# Patient Record
Sex: Male | Born: 1970 | Race: Black or African American | Hispanic: No | Marital: Single | Smoking: Never smoker
Health system: Southern US, Community
[De-identification: ages and names within clinical notes are randomized; demographics above are authoritative.]

## PROBLEM LIST (undated history)

## (undated) DIAGNOSIS — T7840XA Allergy, unspecified, initial encounter: Secondary | ICD-10-CM

## (undated) DIAGNOSIS — G4733 Obstructive sleep apnea (adult) (pediatric): Secondary | ICD-10-CM

## (undated) DIAGNOSIS — L309 Dermatitis, unspecified: Secondary | ICD-10-CM

## (undated) DIAGNOSIS — I10 Essential (primary) hypertension: Secondary | ICD-10-CM

## (undated) DIAGNOSIS — D573 Sickle-cell trait: Secondary | ICD-10-CM

## (undated) DIAGNOSIS — E559 Vitamin D deficiency, unspecified: Secondary | ICD-10-CM

## (undated) DIAGNOSIS — N3281 Overactive bladder: Secondary | ICD-10-CM

## (undated) HISTORY — DX: Essential (primary) hypertension: I10

## (undated) HISTORY — DX: Sickle-cell trait: D57.3

## (undated) HISTORY — DX: Allergy, unspecified, initial encounter: T78.40XA

## (undated) HISTORY — PX: NO PAST SURGERIES: SHX2092

---

## 2015-03-19 ENCOUNTER — Encounter: Payer: Self-pay | Admitting: *Deleted

## 2015-03-20 ENCOUNTER — Ambulatory Visit: Payer: BLUE CROSS/BLUE SHIELD | Admitting: Family Medicine

## 2015-03-25 ENCOUNTER — Encounter: Payer: Self-pay | Admitting: Family Medicine

## 2015-03-25 ENCOUNTER — Ambulatory Visit (INDEPENDENT_AMBULATORY_CARE_PROVIDER_SITE_OTHER): Payer: BLUE CROSS/BLUE SHIELD | Admitting: Family Medicine

## 2015-03-25 VITALS — BP 118/70 | HR 68 | Temp 98.5°F | Resp 14 | Ht 75.0 in | Wt 206.0 lb

## 2015-03-25 DIAGNOSIS — Z113 Encounter for screening for infections with a predominantly sexual mode of transmission: Secondary | ICD-10-CM | POA: Diagnosis not present

## 2015-03-25 DIAGNOSIS — Z0000 Encounter for general adult medical examination without abnormal findings: Secondary | ICD-10-CM | POA: Diagnosis not present

## 2015-03-25 DIAGNOSIS — K4090 Unilateral inguinal hernia, without obstruction or gangrene, not specified as recurrent: Secondary | ICD-10-CM

## 2015-03-25 DIAGNOSIS — I10 Essential (primary) hypertension: Secondary | ICD-10-CM

## 2015-03-25 HISTORY — DX: Unilateral inguinal hernia, without obstruction or gangrene, not specified as recurrent: K40.90

## 2015-03-25 NOTE — Progress Notes (Signed)
Patient ID: Aaron Page, male   DOB: 09-06-1971, 44 y.o.   MRN: [id number]   Subjective:    Patient ID: Aaron Page, male    DOB: 07-01-1971, 44 y.o.   MRN: [id number]  Patient presents for CPE  here to establish care and for physical exam. He is concerned about urinary frequency with a new blood pressure medicine he was put on 3 months ago. He states that his blood pressure which is a little bit high but he was having a lot of stress at that time was also moving to [place] but his nurse practitioner put him on terraces in 3 mg which she takes in the morning. He does not have any history of BPH. He was not tried on any other medications. He also has occasional pain in his right inguinal region he has not noticed any significant hernia. He does have history of irritable bowel syndrome and had early EGD and colonoscopy but those symptoms have resolved. 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 He tries to stay active and keep his weight down to control his pain.  He thinks he has tetanus booster in 2011. He is due for fasting labs.  Review Of Systems:  GEN- denies fatigue, fever, weight loss,weakness, recent illness HEENT- denies eye drainage, change in vision, nasal discharge, CVS- denies chest pain, palpitations RESP- denies SOB, cough, wheeze ABD- denies N/V, change in stools, abd pain GU- denies dysuria, hematuria, dribbling, incontinence MSK- denies joint pain, muscle aches, injury Neuro- denies headache, dizziness, syncope, seizure activity       Objective:    BP 118/70 mmHg  Pulse 68  Temp(Src) 98.5 F (36.9 C) (Oral)  Resp 14  Ht 6\' 3"  (1.905 m)  Wt 206 lb (93.441 kg)  BMI 25.75 kg/m2 GEN- NAD, alert and oriented x3 HEENT- PERRL, EOMI, non injected sclera, pink conjunctiva, MMM, oropharynx clear Neck- Supple, no thyromegaly CVS- RRR,  no murmur RESP-CTAB ABD-NABS,soft,NT,ND GU- Small bulge in right inguinal region, testes descended bilat, no penile lesions EXT- No edema Pulses- Radial, DP- 2+        Assessment & Plan:      Problem List Items Addressed This Visit    None    Visit Diagnoses    Routine general medical examination at a health care facility    -  Primary    CPE done, fasting labs with STD screen at request in AM, obtain records       Note: This dictation was prepared with Dragon dictation along with smaller phrase technology. Any transcriptional errors that result from this process are unintentional.

## 2015-03-25 NOTE — Patient Instructions (Signed)
Cut back Hytrin 1 mg once a day Return for fasting labs  Don't start any supplements until I call with results  F/U 6 months

## 2015-03-25 NOTE — Addendum Note (Signed)
Addended by: Vic Blackbird F on: 03/25/2015 05:33 PM   Modules accepted: Orders

## 2015-03-25 NOTE — Assessment & Plan Note (Signed)
Small inguinal hernia, not significant at this time

## 2015-03-25 NOTE — Assessment & Plan Note (Signed)
Query if pt really needs BP meds, I am going to try to titrate him off of this, decrease to 1mg  of terazosin for 2 weeks, we will f/u via phone for his BP readings Consider D/C all together or switch to another med as he is having urinary frequency with this.

## 2015-03-26 ENCOUNTER — Other Ambulatory Visit: Payer: BLUE CROSS/BLUE SHIELD

## 2015-03-26 LAB — COMPREHENSIVE METABOLIC PANEL
ALBUMIN: 4.2 g/dL (ref 3.5–5.2)
ALK PHOS: 42 U/L (ref 39–117)
ALT: 18 U/L (ref 0–53)
AST: 16 U/L (ref 0–37)
BILIRUBIN TOTAL: 0.9 mg/dL (ref 0.2–1.2)
BUN: 16 mg/dL (ref 6–23)
CO2: 27 mEq/L (ref 19–32)
Calcium: 9.5 mg/dL (ref 8.4–10.5)
Chloride: 105 mEq/L (ref 96–112)
Creat: 1.04 mg/dL (ref 0.50–1.35)
Glucose, Bld: 86 mg/dL (ref 70–99)
Potassium: 4.7 mEq/L (ref 3.5–5.3)
Sodium: 141 mEq/L (ref 135–145)
Total Protein: 6.8 g/dL (ref 6.0–8.3)

## 2015-03-26 LAB — CBC WITH DIFFERENTIAL/PLATELET
BASOS ABS: 0 10*3/uL (ref 0.0–0.1)
Basophils Relative: 0 % (ref 0–1)
EOS PCT: 1 % (ref 0–5)
Eosinophils Absolute: 0 10*3/uL (ref 0.0–0.7)
HEMATOCRIT: 42.5 % (ref 39.0–52.0)
Hemoglobin: 14.3 g/dL (ref 13.0–17.0)
LYMPHS PCT: 32 % (ref 12–46)
Lymphs Abs: 1.2 10*3/uL (ref 0.7–4.0)
MCH: 26.2 pg (ref 26.0–34.0)
MCHC: 33.6 g/dL (ref 30.0–36.0)
MCV: 78 fL (ref 78.0–100.0)
MONO ABS: 0.2 10*3/uL (ref 0.1–1.0)
MONOS PCT: 6 % (ref 3–12)
MPV: 10.3 fL (ref 8.6–12.4)
NEUTROS ABS: 2.4 10*3/uL (ref 1.7–7.7)
Neutrophils Relative %: 61 % (ref 43–77)
PLATELETS: 253 10*3/uL (ref 150–400)
RBC: 5.45 MIL/uL (ref 4.22–5.81)
RDW: 15.6 % — ABNORMAL HIGH (ref 11.5–15.5)
WBC: 3.9 10*3/uL — AB (ref 4.0–10.5)

## 2015-03-26 LAB — LIPID PANEL
CHOLESTEROL: 189 mg/dL (ref 0–200)
HDL: 60 mg/dL (ref >=40)
LDL Cholesterol: 116 mg/dL — ABNORMAL HIGH (ref 0–99)
Total CHOL/HDL Ratio: 3.2 Ratio
Triglycerides: 64 mg/dL (ref <150)
VLDL: 13 mg/dL (ref 0–40)

## 2015-03-26 LAB — RPR

## 2015-03-26 LAB — TSH: TSH: 0.87 u[IU]/mL (ref 0.350–4.500)

## 2015-03-27 LAB — VITAMIN D 25 HYDROXY (VIT D DEFICIENCY, FRACTURES): Vit D, 25-Hydroxy: 16 ng/mL — ABNORMAL LOW (ref 30–100)

## 2015-03-27 LAB — HIV ANTIBODY (ROUTINE TESTING W REFLEX): HIV 1&2 Ab, 4th Generation: NONREACTIVE

## 2015-03-28 LAB — GC/CHLAMYDIA PROBE AMP, URINE
Chlamydia, Swab/Urine, PCR: NEGATIVE
GC Probe Amp, Urine: NEGATIVE

## 2015-03-31 ENCOUNTER — Other Ambulatory Visit: Payer: Self-pay | Admitting: *Deleted

## 2015-03-31 MED ORDER — VITAMIN D (ERGOCALCIFEROL) 1.25 MG (50000 UNIT) PO CAPS: ORAL_CAPSULE | ORAL | Status: DC

## 2015-09-09 ENCOUNTER — Ambulatory Visit: Payer: BLUE CROSS/BLUE SHIELD | Admitting: Family Medicine

## 2015-09-14 ENCOUNTER — Ambulatory Visit: Payer: BLUE CROSS/BLUE SHIELD | Admitting: Family Medicine

## 2015-09-14 ENCOUNTER — Encounter: Payer: Self-pay | Admitting: Family Medicine

## 2015-10-16 ENCOUNTER — Encounter: Payer: Self-pay | Admitting: Family Medicine

## 2015-10-16 ENCOUNTER — Ambulatory Visit (INDEPENDENT_AMBULATORY_CARE_PROVIDER_SITE_OTHER): Payer: BLUE CROSS/BLUE SHIELD | Admitting: Family Medicine

## 2015-10-16 VITALS — BP 138/74 | HR 72 | Temp 98.8°F | Resp 14 | Ht 75.0 in | Wt 207.0 lb

## 2015-10-16 DIAGNOSIS — Z113 Encounter for screening for infections with a predominantly sexual mode of transmission: Secondary | ICD-10-CM | POA: Diagnosis not present

## 2015-10-16 DIAGNOSIS — E559 Vitamin D deficiency, unspecified: Secondary | ICD-10-CM | POA: Diagnosis not present

## 2015-10-16 DIAGNOSIS — I10 Essential (primary) hypertension: Secondary | ICD-10-CM | POA: Diagnosis not present

## 2015-10-16 DIAGNOSIS — L309 Dermatitis, unspecified: Secondary | ICD-10-CM | POA: Insufficient documentation

## 2015-10-16 LAB — COMPREHENSIVE METABOLIC PANEL
ALT: 18 U/L (ref 9–46)
AST: 19 U/L (ref 10–40)
Albumin: 4 g/dL (ref 3.6–5.1)
Alkaline Phosphatase: 54 U/L (ref 40–115)
BUN: 14 mg/dL (ref 7–25)
CHLORIDE: 106 mmol/L (ref 98–110)
CO2: 28 mmol/L (ref 20–31)
CREATININE: 0.99 mg/dL (ref 0.60–1.35)
Calcium: 9.2 mg/dL (ref 8.6–10.3)
GLUCOSE: 86 mg/dL (ref 70–99)
Potassium: 4.3 mmol/L (ref 3.5–5.3)
SODIUM: 139 mmol/L (ref 135–146)
TOTAL PROTEIN: 6.5 g/dL (ref 6.1–8.1)
Total Bilirubin: 0.8 mg/dL (ref 0.2–1.2)

## 2015-10-16 LAB — CBC WITH DIFFERENTIAL/PLATELET
BASOS PCT: 1 % (ref 0–1)
Basophils Absolute: 0 10*3/uL (ref 0.0–0.1)
EOS ABS: 0 10*3/uL (ref 0.0–0.7)
Eosinophils Relative: 1 % (ref 0–5)
HCT: 43.4 % (ref 39.0–52.0)
Hemoglobin: 14.2 g/dL (ref 13.0–17.0)
Lymphocytes Relative: 43 % (ref 12–46)
Lymphs Abs: 1.3 10*3/uL (ref 0.7–4.0)
MCH: 25.9 pg — AB (ref 26.0–34.0)
MCHC: 32.7 g/dL (ref 30.0–36.0)
MCV: 79.1 fL (ref 78.0–100.0)
MONOS PCT: 4 % (ref 3–12)
MPV: 10.4 fL (ref 8.6–12.4)
Monocytes Absolute: 0.1 10*3/uL (ref 0.1–1.0)
NEUTROS PCT: 51 % (ref 43–77)
Neutro Abs: 1.5 10*3/uL — ABNORMAL LOW (ref 1.7–7.7)
PLATELETS: 251 10*3/uL (ref 150–400)
RBC: 5.49 MIL/uL (ref 4.22–5.81)
RDW: 15 % (ref 11.5–15.5)
WBC: 3 10*3/uL — ABNORMAL LOW (ref 4.0–10.5)

## 2015-10-16 MED ORDER — TERAZOSIN HCL 1 MG PO CAPS: 1.0000 mg | ORAL_CAPSULE | Freq: Every day | ORAL | Status: DC

## 2015-10-16 MED ORDER — TRIAMCINOLONE ACETONIDE 0.1 % EX CREA: 1.0000 "application " | TOPICAL_CREAM | Freq: Two times a day (BID) | CUTANEOUS | Status: DC

## 2015-10-16 NOTE — Assessment & Plan Note (Signed)
Triamcinolone cream BID as well as moisturizer.

## 2015-10-16 NOTE — Assessment & Plan Note (Signed)
Well controlled, no chagne to meds

## 2015-10-16 NOTE — Patient Instructions (Signed)
Use cream twice a day We will call with lab results F/U 6 months for PHYSICAL

## 2015-10-16 NOTE — Progress Notes (Signed)
Patient ID: Aaron Page, male   DOB: [birth year], 44 y.o.   MRN: [id number]   Subjective:    Patient ID: Aaron Page, male    DOB: [birth year], 44 y.o.   MRN: [id number]  Patient presents for 6 month F/U  patient here for follow-up. He's been doing well with a 1 mg of the Hytrin. His urinary frequency has resolved. He would like to be rechecked for STDs again today. He also needs a metabolic and a repeat vitamin D as he was deficient.  He has an itchy rash that has been on his inner left elbow for the past few months. It comes and goes. When the weather is dry and cold it itches more. He has been using vitamin E and topical lotions. He did try Neosporin a couple times with no improvement.   Review Of Systems:  GEN- denies fatigue, fever, weight loss,weakness, recent illness HEENT- denies eye drainage, change in vision, nasal discharge, CVS- denies chest pain, palpitations RESP- denies SOB, cough, wheeze ABD- denies N/V, change in stools, abd pain GU- denies dysuria, hematuria, dribbling, incontinence MSK- denies joint pain, muscle aches, injury Neuro- denies headache, dizziness, syncope, seizure activity       Objective:    BP 138/74 mmHg  Pulse 72  Temp(Src) 98.8 F (37.1 C) (Oral)  Resp 14  Ht 6\' 3"  (1.905 m)  Wt 207 lb (93.895 kg)  BMI 25.87 kg/m2 GEN- NAD, alert and oriented x3 HEENT- PERRL, EOMI, non injected sclera, pink conjunctiva, MMM, oropharynx clear CVS- RRR, no murmur RESP-CTAB EXT- No edema sKIN- small quarter-sized scaly eczematous rash on left antecubital fossa. No blistering no vesicles. Nontender Pulses- Radial  2+        Assessment & Plan:      Problem List Items Addressed This Visit    Essential hypertension - Primary    Well controlled, no chagne to meds      Relevant Medications   terazosin (HYTRIN) 1 MG capsule   Other Relevant Orders   CBC with Differential/Platelet   Comprehensive metabolic panel   Eczema    Triamcinolone  cream BID as well as moisturizer.       Other Visit Diagnoses    Screen for STD (sexually transmitted disease)        Relevant Orders    HIV antibody (with reflex)    RPR    GC/chlamydia probe amp, urine    Vitamin D deficiency        Relevant Orders    Vitamin D, 25-hydroxy       Note: This dictation was prepared with Dragon dictation along with smaller phrase technology. Any transcriptional errors that result from this process are unintentional.

## 2015-10-17 LAB — GC/CHLAMYDIA PROBE AMP, URINE
Chlamydia, Swab/Urine, PCR: NOT DETECTED
GC PROBE AMP, URINE: NOT DETECTED

## 2015-10-17 LAB — RPR

## 2015-10-17 LAB — HIV ANTIBODY (ROUTINE TESTING W REFLEX): HIV: NONREACTIVE

## 2015-10-17 LAB — VITAMIN D 25 HYDROXY (VIT D DEFICIENCY, FRACTURES): VIT D 25 HYDROXY: 17 ng/mL — AB (ref 30–100)

## 2015-10-22 ENCOUNTER — Encounter: Payer: Self-pay | Admitting: *Deleted

## 2015-10-22 ENCOUNTER — Encounter: Payer: Self-pay | Admitting: Family Medicine

## 2015-10-22 MED ORDER — VITAMIN D (ERGOCALCIFEROL) 1.25 MG (50000 UNIT) PO CAPS: ORAL_CAPSULE | ORAL | Status: DC

## 2016-01-18 ENCOUNTER — Encounter: Payer: Self-pay | Admitting: Family Medicine

## 2016-02-15 ENCOUNTER — Ambulatory Visit (INDEPENDENT_AMBULATORY_CARE_PROVIDER_SITE_OTHER): Payer: BLUE CROSS/BLUE SHIELD | Admitting: Family Medicine

## 2016-02-15 ENCOUNTER — Encounter: Payer: Self-pay | Admitting: Family Medicine

## 2016-02-15 VITALS — BP 132/80 | HR 86 | Temp 98.2°F | Resp 12 | Ht 75.0 in | Wt 211.0 lb

## 2016-02-15 DIAGNOSIS — G8929 Other chronic pain: Secondary | ICD-10-CM

## 2016-02-15 DIAGNOSIS — R1013 Epigastric pain: Secondary | ICD-10-CM

## 2016-02-15 DIAGNOSIS — F411 Generalized anxiety disorder: Secondary | ICD-10-CM

## 2016-02-15 DIAGNOSIS — K625 Hemorrhage of anus and rectum: Secondary | ICD-10-CM

## 2016-02-15 DIAGNOSIS — M549 Dorsalgia, unspecified: Secondary | ICD-10-CM | POA: Diagnosis not present

## 2016-02-15 DIAGNOSIS — L720 Epidermal cyst: Secondary | ICD-10-CM

## 2016-02-15 HISTORY — DX: Generalized anxiety disorder: F41.1

## 2016-02-15 MED ORDER — DEXLANSOPRAZOLE 60 MG PO CPDR: 60.0000 mg | DELAYED_RELEASE_CAPSULE | Freq: Every day | ORAL | Status: DC

## 2016-02-15 MED ORDER — ESCITALOPRAM OXALATE 10 MG PO TABS: 10.0000 mg | ORAL_TABLET | Freq: Every day | ORAL | Status: DC

## 2016-02-15 MED ORDER — HYDROCORTISONE ACE-PRAMOXINE 1-1 % RE CREA: 1.0000 "application " | TOPICAL_CREAM | Freq: Two times a day (BID) | RECTAL | Status: DC
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
## 2016-02-15 NOTE — Patient Instructions (Signed)
Take lexapro at bedtime for stress/anxiety Take dexilant in morning for gastritis/acid reflux Do not take any aleve or ibuprofen  Return the stool cards We will call with lab results Use stool softener- for your bowels Suppository given  F/U pending results

## 2016-02-16 LAB — COMPREHENSIVE METABOLIC PANEL
ALK PHOS: 44 U/L (ref 40–115)
ALT: 18 U/L (ref 9–46)
AST: 19 U/L (ref 10–40)
Albumin: 4.2 g/dL (ref 3.6–5.1)
BILIRUBIN TOTAL: 0.7 mg/dL (ref 0.2–1.2)
BUN: 13 mg/dL (ref 7–25)
CO2: 22 mmol/L (ref 20–31)
Calcium: 9.2 mg/dL (ref 8.6–10.3)
Chloride: 105 mmol/L (ref 98–110)
Creat: 0.93 mg/dL (ref 0.60–1.35)
GLUCOSE: 84 mg/dL (ref 70–99)
Potassium: 4.4 mmol/L (ref 3.5–5.3)
SODIUM: 140 mmol/L (ref 135–146)
Total Protein: 7 g/dL (ref 6.1–8.1)

## 2016-02-16 LAB — CBC WITH DIFFERENTIAL/PLATELET
BASOS PCT: 0 %
Basophils Absolute: 0 cells/uL (ref 0–200)
Eosinophils Absolute: 32 cells/uL (ref 15–500)
Eosinophils Relative: 1 %
HCT: 45.5 % (ref 38.5–50.0)
Hemoglobin: 14.7 g/dL (ref 13.0–17.0)
LYMPHS PCT: 41 %
Lymphs Abs: 1312 cells/uL (ref 850–3900)
MCH: 26.1 pg — ABNORMAL LOW (ref 27.0–33.0)
MCHC: 32.3 g/dL (ref 32.0–36.0)
MCV: 80.7 fL (ref 80.0–100.0)
MONO ABS: 192 {cells}/uL — AB (ref 200–950)
MONOS PCT: 6 %
MPV: 9.8 fL (ref 7.5–12.5)
Neutro Abs: 1664 cells/uL (ref 1500–7800)
Neutrophils Relative %: 52 %
PLATELETS: 221 10*3/uL (ref 140–400)
RBC: 5.64 MIL/uL (ref 4.20–5.80)
RDW: 15.4 % — AB (ref 11.0–15.0)
WBC: 3.2 10*3/uL — AB (ref 3.8–10.8)

## 2016-02-16 LAB — H. PYLORI BREATH TEST: H. pylori Breath Test: NOT DETECTED

## 2016-02-16 LAB — LIPASE: Lipase: 20 U/L (ref 7–60)

## 2016-02-17 ENCOUNTER — Other Ambulatory Visit: Payer: Self-pay | Admitting: *Deleted

## 2016-02-17 ENCOUNTER — Encounter: Payer: Self-pay | Admitting: Family Medicine

## 2016-02-17 MED ORDER — DEXLANSOPRAZOLE 60 MG PO CPDR: 60.0000 mg | DELAYED_RELEASE_CAPSULE | Freq: Every day | ORAL | Status: DC

## 2016-02-17 NOTE — Assessment & Plan Note (Signed)
He will follow up with his orthopedist. It is still concerning that he received medications via mail Advised he can use the Diclofenac gel

## 2016-02-17 NOTE — Addendum Note (Signed)
Addended by: Vic Blackbird F on: 02/17/2016 04:58 PM   Modules accepted: Orders

## 2016-02-17 NOTE — Progress Notes (Signed)
   I failed to mention that he also had a small cyst on his 4 head which should been present for a few years he noticed that it was getting a little bit smaller. He states that when he gets stressed at times it seems like it is bulging out. He would like to have this removed.  He has small cyst center of forehead   Refer to plastic surgery

## 2016-02-17 NOTE — Assessment & Plan Note (Signed)
Trial of Lexpro 10mg 

## 2016-03-05 ENCOUNTER — Other Ambulatory Visit: Payer: Self-pay | Admitting: Family Medicine

## 2016-03-07 NOTE — Telephone Encounter (Signed)
Refill refused.   12 week course completed.

## 2016-04-06 ENCOUNTER — Encounter: Payer: Self-pay | Admitting: Family Medicine

## 2016-08-06 ENCOUNTER — Emergency Department (HOSPITAL_COMMUNITY)
Admission: EM | Admit: 2016-08-06 | Discharge: 2016-08-06 | Disposition: A | Discharge status: Home / Self Care | Payer: BLUE CROSS/BLUE SHIELD | Attending: Emergency Medicine | Admitting: Emergency Medicine

## 2016-08-06 ENCOUNTER — Encounter (HOSPITAL_COMMUNITY): Payer: Self-pay

## 2016-08-06 ENCOUNTER — Emergency Department (HOSPITAL_COMMUNITY): Payer: BLUE CROSS/BLUE SHIELD

## 2016-08-06 DIAGNOSIS — Y999 Unspecified external cause status: Secondary | ICD-10-CM | POA: Diagnosis not present

## 2016-08-06 DIAGNOSIS — R079 Chest pain, unspecified: Secondary | ICD-10-CM | POA: Insufficient documentation

## 2016-08-06 DIAGNOSIS — I10 Essential (primary) hypertension: Secondary | ICD-10-CM | POA: Insufficient documentation

## 2016-08-06 DIAGNOSIS — R1011 Right upper quadrant pain: Secondary | ICD-10-CM | POA: Insufficient documentation

## 2016-08-06 DIAGNOSIS — Y92410 Unspecified street and highway as the place of occurrence of the external cause: Secondary | ICD-10-CM | POA: Diagnosis not present

## 2016-08-06 DIAGNOSIS — Z79899 Other long term (current) drug therapy: Secondary | ICD-10-CM | POA: Insufficient documentation

## 2016-08-06 DIAGNOSIS — Y9389 Activity, other specified: Secondary | ICD-10-CM | POA: Insufficient documentation

## 2016-08-06 DIAGNOSIS — S0990XA Unspecified injury of head, initial encounter: Secondary | ICD-10-CM | POA: Diagnosis present

## 2016-08-06 DIAGNOSIS — R109 Unspecified abdominal pain: Secondary | ICD-10-CM

## 2016-08-06 DIAGNOSIS — S060X0A Concussion without loss of consciousness, initial encounter: Secondary | ICD-10-CM | POA: Diagnosis not present

## 2016-08-06 LAB — I-STAT CHEM 8, ED
BUN: 19 mg/dL (ref 6–20)
CREATININE: 1.1 mg/dL (ref 0.61–1.24)
Calcium, Ion: 1.17 mmol/L (ref 1.15–1.40)
Chloride: 106 mmol/L (ref 101–111)
GLUCOSE: 94 mg/dL (ref 65–99)
HEMATOCRIT: 47 % (ref 39.0–52.0)
HEMOGLOBIN: 16 g/dL (ref 13.0–17.0)
Potassium: 4.3 mmol/L (ref 3.5–5.1)
Sodium: 142 mmol/L (ref 135–145)
TCO2: 25 mmol/L (ref 0–100)

## 2016-08-06 MED ORDER — ONDANSETRON HCL 4 MG/2ML IJ SOLN
4.0000 mg | Freq: Once | INTRAMUSCULAR | Status: AC
  Administered 2016-08-06: 4 mg via INTRAVENOUS
  Filled 2016-08-06: qty 2

## 2016-08-06 MED ORDER — METHOCARBAMOL 500 MG PO TABS: 500.0000 mg | ORAL_TABLET | Freq: Two times a day (BID) | ORAL | 0 refills | Status: DC

## 2016-08-06 MED ORDER — NAPROXEN 500 MG PO TABS: 500.0000 mg | ORAL_TABLET | Freq: Two times a day (BID) | ORAL | 0 refills | Status: DC

## 2016-08-06 MED ORDER — MORPHINE SULFATE (PF) 4 MG/ML IV SOLN
4.0000 mg | Freq: Once | INTRAVENOUS | Status: AC
  Administered 2016-08-06: 4 mg via INTRAVENOUS
  Filled 2016-08-06: qty 1

## 2016-08-06 MED ORDER — IOPAMIDOL (ISOVUE-300) INJECTION 61%
100.0000 mL | Freq: Once | INTRAVENOUS | Status: AC | PRN
  Administered 2016-08-06: 100 mL via INTRAVENOUS

## 2016-08-06 MED ORDER — MORPHINE SULFATE (PF) 2 MG/ML IV SOLN
2.0000 mg | Freq: Once | INTRAVENOUS | Status: AC
Start: 2016-08-06 — End: 2016-08-06
  Administered 2016-08-06: 2 mg via INTRAVENOUS
  Filled 2016-08-06: qty 1

## 2016-08-06 MED ORDER — HYDROCODONE-ACETAMINOPHEN 5-325 MG PO TABS: 1.0000 | ORAL_TABLET | ORAL | 0 refills | Status: DC | PRN
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
## 2016-08-06 NOTE — ED Notes (Signed)
Bed: [facility] Expected date:  Expected time:  Means of arrival:  Comments: 
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
## 2016-08-12 ENCOUNTER — Ambulatory Visit (INDEPENDENT_AMBULATORY_CARE_PROVIDER_SITE_OTHER): Payer: BLUE CROSS/BLUE SHIELD | Admitting: Family Medicine

## 2016-08-12 ENCOUNTER — Encounter: Payer: Self-pay | Admitting: Family Medicine

## 2016-08-12 DIAGNOSIS — F411 Generalized anxiety disorder: Secondary | ICD-10-CM | POA: Diagnosis not present

## 2016-08-12 DIAGNOSIS — S060X0D Concussion without loss of consciousness, subsequent encounter: Secondary | ICD-10-CM

## 2016-08-12 DIAGNOSIS — S39012D Strain of muscle, fascia and tendon of lower back, subsequent encounter: Secondary | ICD-10-CM | POA: Diagnosis not present

## 2016-08-12 MED ORDER — METHOCARBAMOL 500 MG PO TABS: 500.0000 mg | ORAL_TABLET | Freq: Two times a day (BID) | ORAL | 0 refills | Status: DC

## 2016-08-12 MED ORDER — NAPROXEN 500 MG PO TABS: 500.0000 mg | ORAL_TABLET | Freq: Two times a day (BID) | ORAL | 0 refills | Status: DC

## 2016-08-12 MED ORDER — ESCITALOPRAM OXALATE 10 MG PO TABS: 10.0000 mg | ORAL_TABLET | Freq: Every day | ORAL | 6 refills | Status: DC

## 2016-08-12 NOTE — Patient Instructions (Addendum)
F/u 6 MONTHS for  Physical   
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
## 2016-08-19 ENCOUNTER — Encounter: Payer: Self-pay | Admitting: Family Medicine

## 2016-10-12 ENCOUNTER — Encounter: Payer: Self-pay | Admitting: Family Medicine

## 2016-10-12 MED ORDER — ESCITALOPRAM OXALATE 10 MG PO TABS
10.0000 mg | ORAL_TABLET | Freq: Every day | ORAL | 6 refills | Status: DC
Start: 2016-10-12 — End: 2018-02-07

## 2016-10-14 ENCOUNTER — Ambulatory Visit (INDEPENDENT_AMBULATORY_CARE_PROVIDER_SITE_OTHER): Payer: BLUE CROSS/BLUE SHIELD | Admitting: Family Medicine

## 2016-10-14 ENCOUNTER — Encounter: Payer: Self-pay | Admitting: Family Medicine

## 2016-10-14 VITALS — BP 134/90 | HR 78 | Temp 98.6°F | Resp 16 | Ht 75.0 in | Wt 208.0 lb

## 2016-10-14 DIAGNOSIS — R1032 Left lower quadrant pain: Secondary | ICD-10-CM | POA: Diagnosis not present

## 2016-10-14 DIAGNOSIS — R300 Dysuria: Secondary | ICD-10-CM | POA: Diagnosis not present

## 2016-10-14 DIAGNOSIS — R1031 Right lower quadrant pain: Secondary | ICD-10-CM

## 2016-10-14 LAB — COMPLETE METABOLIC PANEL WITH GFR
ALT: 19 U/L (ref 9–46)
AST: 20 U/L (ref 10–40)
Albumin: 4.3 g/dL (ref 3.6–5.1)
Alkaline Phosphatase: 45 U/L (ref 40–115)
BILIRUBIN TOTAL: 0.8 mg/dL (ref 0.2–1.2)
BUN: 13 mg/dL (ref 7–25)
CHLORIDE: 107 mmol/L (ref 98–110)
CO2: 27 mmol/L (ref 20–31)
Calcium: 9.1 mg/dL (ref 8.6–10.3)
Creat: 1.03 mg/dL (ref 0.60–1.35)
GFR, EST NON AFRICAN AMERICAN: 87 mL/min (ref >=60)
GLUCOSE: 82 mg/dL (ref 70–99)
POTASSIUM: 4.5 mmol/L (ref 3.5–5.3)
SODIUM: 142 mmol/L (ref 135–146)
Total Protein: 6.8 g/dL (ref 6.1–8.1)

## 2016-10-14 LAB — CBC WITH DIFFERENTIAL/PLATELET
BASOS ABS: 0 {cells}/uL (ref 0–200)
Basophils Relative: 0 %
EOS ABS: 35 {cells}/uL (ref 15–500)
EOS PCT: 1 %
HCT: 42.8 % (ref 38.5–50.0)
Hemoglobin: 14.2 g/dL (ref 13.0–17.0)
LYMPHS PCT: 35 %
Lymphs Abs: 1225 cells/uL (ref 850–3900)
MCH: 26.3 pg — AB (ref 27.0–33.0)
MCHC: 33.2 g/dL (ref 32.0–36.0)
MCV: 79.4 fL — AB (ref 80.0–100.0)
MONOS PCT: 8 %
MPV: 9.8 fL (ref 7.5–12.5)
Monocytes Absolute: 280 cells/uL (ref 200–950)
NEUTROS ABS: 1960 {cells}/uL (ref 1500–7800)
Neutrophils Relative %: 56 %
PLATELETS: 236 10*3/uL (ref 140–400)
RBC: 5.39 MIL/uL (ref 4.20–5.80)
RDW: 15.8 % — AB (ref 11.0–15.0)
WBC: 3.5 10*3/uL — ABNORMAL LOW (ref 3.8–10.8)

## 2016-10-14 LAB — URINALYSIS, ROUTINE W REFLEX MICROSCOPIC
Bilirubin Urine: NEGATIVE
GLUCOSE, UA: NEGATIVE
HGB URINE DIPSTICK: NEGATIVE
KETONES UR: NEGATIVE
LEUKOCYTES UA: NEGATIVE
NITRITE: NEGATIVE
PH: 6.5 (ref 5.0–8.0)
PROTEIN: NEGATIVE
Specific Gravity, Urine: 1.015 (ref 1.001–1.035)

## 2016-10-14 LAB — PSA: PSA: 1.5 ng/mL (ref <=4.0)

## 2016-10-14 MED ORDER — TAMSULOSIN HCL 0.4 MG PO CAPS: 0.4000 mg | ORAL_CAPSULE | Freq: Every day | ORAL | 3 refills | Status: DC

## 2016-10-14 NOTE — Progress Notes (Signed)
Subjective:    Patient ID: Aaron Page, male    DOB: [birth year], 45 y.o.   MRN: [id number]  HPI Patient reports a 2 week history of episodic lower abdominal pain. It occurs in his left and right inguinal creases. He describes it as more of a pressure rather than a pain. It does seem to be relieved when he urinates. He denies any dysuria. He denies any hematuria. He denies any urgency or hesitancy. He does report a slight increase frequency. Previously he was urinating twice at night now is urinating 3 times at night. By the third episode, he does report a slight dribbling stream. However the pain does not resolve when he urinates. It comes and goes on its own. When it occurs, the pain last more than an hour. He denies any fevers or chills. He denies any constipation. He has been having 1 bowel movement a day but is usually loose. He has a history of irritable bowel syndrome. He denies any blood in his stools. On examination today his abdomen is soft nondistended nontender with normal bowel sounds. He reports some tenderness to palpation in the right lower quadrant and in the right upper quadrant but this is mild. There is no physical reaction other than the patient reporting some tenderness. There is no guarding or rebound. Urinalysis is obtained for further evaluation. Past Medical History:  Diagnosis Date  . Hypertension   . Sickle cell trait ([place])    No past surgical history on file. Current Outpatient Prescriptions on File Prior to Visit  Medication Sig Dispense Refill  . escitalopram (LEXAPRO) 10 MG tablet Take 1 tablet (10 mg total) by mouth at bedtime. 30 tablet 6  . gabapentin (NEURONTIN) 300 MG capsule Take 300 mg by mouth 3 (three) times daily as needed (pain).    Marland Kitchen HYDROcodone-acetaminophen (NORCO/VICODIN) 5-325 MG tablet Take 1-2 tablets by mouth every 4 (four) hours as needed for severe pain. 15 tablet 0  . methocarbamol (ROBAXIN) 500 MG tablet Take 1 tablet (500 mg total) by  mouth 2 (two) times daily. 20 tablet 0  . naproxen (NAPROSYN) 500 MG tablet Take 1 tablet (500 mg total) by mouth 2 (two) times daily. 30 tablet 0  . terazosin (HYTRIN) 1 MG capsule Take 1 capsule (1 mg total) by mouth at bedtime. 30 capsule 6  . triamcinolone cream (KENALOG) 0.1 % Apply 1 application topically 2 (two) times daily. 30 g 3   No current facility-administered medications on file prior to visit.    Allergies  Allergen Reactions  . Penicillins Nausea And Vomiting and Rash   Social History   Social History  . Marital status: Single    Spouse name: N/A  . Number of children: N/A  . Years of education: N/A   Occupational History  . Not on file.   Social History Main Topics  . Smoking status: Never Smoker  . Smokeless tobacco: Never Used  . Alcohol use 3.6 oz/week    2 Glasses of wine, 2 Cans of beer, 2 Shots of liquor per week  . Drug use: No  . Sexual activity: Yes   Other Topics Concern  . Not on file   Social History Narrative  . No narrative on file      Review of Systems  All other systems reviewed and are negative.      Objective:   Physical Exam  Constitutional: He appears well-developed and well-nourished. No distress.  HENT:  Head: Normocephalic and atraumatic.  Cardiovascular:  Normal rate, regular rhythm and normal heart sounds.   Pulmonary/Chest: Effort normal and breath sounds normal. No respiratory distress. He has no wheezes. He has no rales.  Abdominal: Soft. Bowel sounds are normal. He exhibits no distension and no mass. There is no tenderness. There is no rebound and no guarding. Hernia confirmed negative in the right inguinal area and confirmed negative in the left inguinal area.  Genitourinary: Penis normal.    Right testis shows no mass and no tenderness. Left testis shows no mass and no tenderness.  Lymphadenopathy:       Right: No inguinal adenopathy present.       Left: No inguinal adenopathy present.  Skin: He is not  diaphoretic.  Vitals reviewed.   Patient reports subjective sense of pressure in the area demarcated with a red circle on the diagram      Assessment & Plan:  Dysuria - Plan: Urinalysis, Routine w reflex microscopic  Bilateral lower abdominal pain  History is nonspecific. Physical exam is unremarkable. I will obtain a urinalysis. History does not support a urinary tract infection and he denies any penile discharge or genital rash.  Analysis is unremarkable. Possibly the patient to be having some mild urinary retention secondary to prostate enlargement. We will check a PSA. I will start the patient empirically on Flomax 0.4 mg by mouth daily at bedtime. Recheck in one week. Keep IBS on the differential diagnosis. Use Lotrimin cream twice a day for athlete's foot that the patient has between his fourth and fifth toes on his right foot

## 2016-11-09 ENCOUNTER — Other Ambulatory Visit: Payer: Self-pay | Admitting: Family Medicine

## 2017-01-25 ENCOUNTER — Other Ambulatory Visit: Payer: Self-pay | Admitting: Family Medicine

## 2017-01-26 NOTE — Telephone Encounter (Signed)
Medication refilled per protocol. 

## 2017-02-07 ENCOUNTER — Encounter: Payer: Self-pay | Admitting: Family Medicine

## 2017-03-07 ENCOUNTER — Encounter: Payer: Self-pay | Admitting: Family Medicine

## 2017-05-18 ENCOUNTER — Other Ambulatory Visit: Payer: Self-pay | Admitting: *Deleted

## 2017-05-18 MED ORDER — TERAZOSIN HCL 1 MG PO CAPS: 1.0000 mg | ORAL_CAPSULE | Freq: Every day | ORAL | 3 refills | Status: DC

## 2017-05-24 ENCOUNTER — Ambulatory Visit: Payer: BLUE CROSS/BLUE SHIELD | Admitting: Family Medicine

## 2017-05-26 ENCOUNTER — Encounter: Payer: Self-pay | Admitting: Family Medicine

## 2017-12-11 ENCOUNTER — Other Ambulatory Visit: Payer: Self-pay | Admitting: Family Medicine

## 2017-12-12 MED ORDER — TRIAMCINOLONE ACETONIDE 0.1 % EX CREA: 1.0000 "application " | TOPICAL_CREAM | Freq: Two times a day (BID) | CUTANEOUS | 0 refills | Status: DC

## 2018-01-23 ENCOUNTER — Encounter: Payer: Self-pay | Admitting: Family Medicine

## 2018-01-24 ENCOUNTER — Other Ambulatory Visit: Payer: Self-pay | Admitting: *Deleted

## 2018-01-24 DIAGNOSIS — Z125 Encounter for screening for malignant neoplasm of prostate: Secondary | ICD-10-CM

## 2018-01-24 DIAGNOSIS — Z13220 Encounter for screening for lipoid disorders: Secondary | ICD-10-CM

## 2018-01-24 DIAGNOSIS — I10 Essential (primary) hypertension: Secondary | ICD-10-CM

## 2018-01-24 DIAGNOSIS — R6882 Decreased libido: Secondary | ICD-10-CM

## 2018-02-01 ENCOUNTER — Encounter: Payer: Self-pay | Admitting: Family Medicine

## 2018-02-02 ENCOUNTER — Other Ambulatory Visit: Payer: BLUE CROSS/BLUE SHIELD

## 2018-02-02 DIAGNOSIS — R6882 Decreased libido: Secondary | ICD-10-CM

## 2018-02-02 DIAGNOSIS — I10 Essential (primary) hypertension: Secondary | ICD-10-CM

## 2018-02-02 DIAGNOSIS — Z13220 Encounter for screening for lipoid disorders: Secondary | ICD-10-CM

## 2018-02-02 DIAGNOSIS — Z125 Encounter for screening for malignant neoplasm of prostate: Secondary | ICD-10-CM

## 2018-02-03 LAB — COMPLETE METABOLIC PANEL WITH GFR
AG Ratio: 1.5 (calc) (ref 1.0–2.5)
ALBUMIN MSPROF: 4.1 g/dL (ref 3.6–5.1)
ALKALINE PHOSPHATASE (APISO): 46 U/L (ref 40–115)
ALT: 18 U/L (ref 9–46)
AST: 18 U/L (ref 10–40)
BUN: 16 mg/dL (ref 7–25)
CO2: 26 mmol/L (ref 20–32)
CREATININE: 1.07 mg/dL (ref 0.60–1.35)
Calcium: 9.3 mg/dL (ref 8.6–10.3)
Chloride: 107 mmol/L (ref 98–110)
GFR, EST AFRICAN AMERICAN: 95 mL/min/{1.73_m2} (ref >=60)
GFR, Est Non African American: 82 mL/min/{1.73_m2} (ref >=60)
GLUCOSE: 93 mg/dL (ref 65–99)
Globulin: 2.7 g/dL (calc) (ref 1.9–3.7)
Potassium: 4.2 mmol/L (ref 3.5–5.3)
Sodium: 140 mmol/L (ref 135–146)
TOTAL PROTEIN: 6.8 g/dL (ref 6.1–8.1)
Total Bilirubin: 0.7 mg/dL (ref 0.2–1.2)

## 2018-02-03 LAB — CBC WITH DIFFERENTIAL/PLATELET
BASOS PCT: 0.7 %
Basophils Absolute: 30 cells/uL (ref 0–200)
EOS PCT: 1.2 %
Eosinophils Absolute: 52 cells/uL (ref 15–500)
HCT: 41.5 % (ref 38.5–50.0)
HEMOGLOBIN: 13.9 g/dL (ref 13.2–17.1)
Lymphs Abs: 1355 cells/uL (ref 850–3900)
MCH: 26.2 pg — ABNORMAL LOW (ref 27.0–33.0)
MCHC: 33.5 g/dL (ref 32.0–36.0)
MCV: 78.3 fL — ABNORMAL LOW (ref 80.0–100.0)
MONOS PCT: 8.3 %
MPV: 11 fL (ref 7.5–12.5)
NEUTROS ABS: 2507 {cells}/uL (ref 1500–7800)
Neutrophils Relative %: 58.3 %
PLATELETS: 242 10*3/uL (ref 140–400)
RBC: 5.3 10*6/uL (ref 4.20–5.80)
RDW: 14.8 % (ref 11.0–15.0)
Total Lymphocyte: 31.5 %
WBC mixed population: 357 cells/uL (ref 200–950)
WBC: 4.3 10*3/uL (ref 3.8–10.8)

## 2018-02-03 LAB — LIPID PANEL
CHOL/HDL RATIO: 3.4 (calc) (ref <5.0)
CHOLESTEROL: 180 mg/dL (ref <200)
HDL: 53 mg/dL (ref >40)
LDL CHOLESTEROL (CALC): 113 mg/dL — AB
Non-HDL Cholesterol (Calc): 127 mg/dL (calc) (ref <130)
TRIGLYCERIDES: 58 mg/dL (ref <150)

## 2018-02-03 LAB — PSA: PSA: 1.4 ng/mL (ref <=4.0)

## 2018-02-03 LAB — TESTOSTERONE: TESTOSTERONE: 539 ng/dL (ref 250–827)

## 2018-02-04 ENCOUNTER — Encounter: Payer: Self-pay | Admitting: Family Medicine

## 2018-02-07 ENCOUNTER — Other Ambulatory Visit: Payer: Self-pay

## 2018-02-07 ENCOUNTER — Encounter: Payer: Self-pay | Admitting: Family Medicine

## 2018-02-07 ENCOUNTER — Ambulatory Visit (INDEPENDENT_AMBULATORY_CARE_PROVIDER_SITE_OTHER): Payer: BLUE CROSS/BLUE SHIELD | Admitting: Family Medicine

## 2018-02-07 VITALS — BP 130/78 | HR 76 | Temp 98.2°F | Resp 16 | Ht 75.0 in | Wt 217.0 lb

## 2018-02-07 DIAGNOSIS — Z23 Encounter for immunization: Secondary | ICD-10-CM

## 2018-02-07 DIAGNOSIS — I10 Essential (primary) hypertension: Secondary | ICD-10-CM | POA: Diagnosis not present

## 2018-02-07 DIAGNOSIS — Z113 Encounter for screening for infections with a predominantly sexual mode of transmission: Secondary | ICD-10-CM

## 2018-02-07 DIAGNOSIS — Z0000 Encounter for general adult medical examination without abnormal findings: Secondary | ICD-10-CM | POA: Diagnosis not present

## 2018-02-07 DIAGNOSIS — L720 Epidermal cyst: Secondary | ICD-10-CM

## 2018-02-07 LAB — URINALYSIS, ROUTINE W REFLEX MICROSCOPIC
BILIRUBIN URINE: NEGATIVE
GLUCOSE, UA: NEGATIVE
Hgb urine dipstick: NEGATIVE
Ketones, ur: NEGATIVE
Leukocytes, UA: NEGATIVE
Nitrite: NEGATIVE
Protein, ur: NEGATIVE
SPECIFIC GRAVITY, URINE: 1.015 (ref 1.001–1.03)
pH: 7 (ref 5.0–8.0)

## 2018-02-07 NOTE — Progress Notes (Signed)
   Subjective:    Patient ID: Aaron Page, male    DOB: Mar 18, 1971, 47 y.o.   MRN: [id number]  Patient presents for CPE (has had labs, but would like STD checked)  Pt here for cpe   Taking hytrin  Fasting labs reviewed  Due for TDAP No specific concerns He has taken black seed oil as anti-inflammatory for his back feels it helps  has been working with a new company and traveling a lot for his job He had been experiencing some fatigue which is why he asked for testosterone to be checked  Follows with dentist and eye doctor for routine checks  Review Of Systems:  GEN- denies fatigue, fever, weight loss,weakness, recent illness HEENT- denies eye drainage, change in vision, nasal discharge, CVS- denies chest pain, palpitations RESP- denies SOB, cough, wheeze ABD- denies N/V, change in stools, abd pain GU- denies dysuria, hematuria, dribbling, incontinence MSK- denies joint pain, muscle aches, injury Neuro- denies headache, dizziness, syncope, seizure activity       Objective:    BP 130/78   Pulse 76   Temp 98.2 F (36.8 C) (Oral)   Resp 16   Ht 6\' 3"  (1.905 m)   Wt 217 lb (98.4 kg)   SpO2 96%   BMI 27.12 kg/m  GEN- NAD, alert and oriented x3 HEENT- PERRL, EOMI, non injected sclera, pink conjunctiva, MMM, oropharynx clear, TM clear bilat no effusion  Neck- Supple, no thyromegaly CVS- RRR, no murmur RESP-CTAB ABD-NABS,soft,NT,ND EXT- No edema Pulses- Radial, DP- 2+        Assessment & Plan:      Problem List Items Addressed This Visit      Unprioritized   Essential hypertension    Well controlled, lipids at goal, renal function normal        Other Visit Diagnoses    Routine general medical examination at a health care facility    -  Primary   CPE done, TDAP, reviewed fasting labs at bedside, prevention UTD,looks well.    Relevant Orders   Tdap vaccine greater than or equal to 7yo IM (Completed)   Screen for STD (sexually transmitted disease)        Relevant Orders   HIV antibody   RPR   C. trachomatis/N. gonorrhoeae RNA   Urinalysis, Routine w reflex microscopic (Completed)   Need for tetanus, diphtheria, and acellular pertussis (Tdap) vaccine in patient of adolescent age or older       Relevant Orders   Tdap vaccine greater than or equal to 7yo IM (Completed)      Note: This dictation was prepared with Dragon dictation along with smaller Company secretary. Any transcriptional errors that result from this process are unintentional.

## 2018-02-07 NOTE — Patient Instructions (Addendum)
F/U 1 YEAR  I recommend eye visit once a year I recommend dental visit every 6 months Goal is to  Exercise 30 minutes 5 days a week We will call with  lab results

## 2018-02-08 ENCOUNTER — Encounter: Payer: Self-pay | Admitting: Family Medicine

## 2018-02-08 LAB — HIV ANTIBODY (ROUTINE TESTING W REFLEX): HIV 1&2 Ab, 4th Generation: NONREACTIVE

## 2018-02-08 LAB — RPR: RPR Ser Ql: NONREACTIVE

## 2018-02-08 NOTE — Assessment & Plan Note (Signed)
Well controlled, lipids at goal, renal function normal

## 2018-02-09 LAB — C. TRACHOMATIS/N. GONORRHOEAE RNA
C. trachomatis RNA, TMA: NOT DETECTED
N. gonorrhoeae RNA, TMA: NOT DETECTED

## 2018-02-12 ENCOUNTER — Encounter: Payer: Self-pay | Admitting: Family Medicine

## 2018-02-26 ENCOUNTER — Encounter: Payer: Self-pay | Admitting: Family Medicine

## 2018-04-16 ENCOUNTER — Other Ambulatory Visit: Payer: Self-pay | Admitting: Family Medicine

## 2018-06-16 ENCOUNTER — Other Ambulatory Visit: Payer: Self-pay | Admitting: Family Medicine

## 2018-06-18 MED ORDER — TRIAMCINOLONE ACETONIDE 0.1 % EX CREA: TOPICAL_CREAM | Freq: Two times a day (BID) | CUTANEOUS | 0 refills | Status: DC

## 2018-07-17 ENCOUNTER — Encounter: Payer: Self-pay | Admitting: Family Medicine

## 2018-07-26 ENCOUNTER — Encounter: Payer: Self-pay | Admitting: Family Medicine

## 2018-08-03 ENCOUNTER — Encounter: Payer: Self-pay | Admitting: Family Medicine

## 2018-08-03 ENCOUNTER — Ambulatory Visit: Payer: BLUE CROSS/BLUE SHIELD | Admitting: Family Medicine

## 2018-08-03 VITALS — BP 160/90 | HR 88 | Temp 98.3°F | Resp 16 | Ht 75.0 in | Wt 221.0 lb

## 2018-08-03 DIAGNOSIS — N3281 Overactive bladder: Secondary | ICD-10-CM | POA: Diagnosis not present

## 2018-08-03 MED ORDER — LOSARTAN POTASSIUM 100 MG PO TABS: 100.0000 mg | ORAL_TABLET | Freq: Every day | ORAL | 3 refills | Status: DC

## 2018-08-03 NOTE — Progress Notes (Signed)
Subjective:    Patient ID: Aaron Page, male    DOB: 1970/12/25, 47 y.o.   MRN: [id number]  HPI Patient states that he has been dealing with increased urinary frequency off and on for years.  Patient states that he has to wake up numerous times at night to urinate.  He also has to urinate frequently throughout the day.  He gets sudden urges to urinate.  He denies any urge incontinence.  At times he has a strong powerful stream.  At other times is a weak dribbling stream.  In the past he has been tried on [employer] in an effort to treat this of the in addition to his blood pressure but he saw no benefit.  His PSA was checked in March and was 1.5.  His blood sugar level showed no evidence of diabetes or diabetes insipidus.  He denies any dysuria.  Prostate is examined today and is normal in size.  It is nontender.  There is no nodularity Past Medical History:  Diagnosis Date  . Hypertension   . Sickle cell trait ([place])    No past surgical history on file. Current Outpatient Medications on File Prior to Visit  Medication Sig Dispense Refill  . ibuprofen (ADVIL,MOTRIN) 800 MG tablet TAKE 1 TABLET BY MOUTH 3 TIMES A DAY WITH FOOD  1  . methocarbamol (ROBAXIN) 500 MG tablet TAKE 1 (ONE) TABLET TWO TIMES DAILY, AS NEEDED FOR SPASM  0   No current facility-administered medications on file prior to visit.    Allergies  Allergen Reactions  . Penicillins Nausea And Vomiting and Rash   Social History   Socioeconomic History  . Marital status: Single    Spouse name: Not on file  . Number of children: Not on file  . Years of education: Not on file  . Highest education level: Not on file  Occupational History  . Not on file  Social Needs  . Financial resource strain: Not on file  . Food insecurity:    Worry: Not on file    Inability: Not on file  . Transportation needs:    Medical: Not on file    Non-medical: Not on file  Tobacco Use  . Smoking status: Never Smoker  . Smokeless  tobacco: Never Used  Substance and Sexual Activity  . Alcohol use: Yes    Alcohol/week: 6.0 standard drinks    Types: 2 Glasses of wine, 2 Cans of beer, 2 Shots of liquor per week  . Drug use: No  . Sexual activity: Yes  Lifestyle  . Physical activity:    Days per week: Not on file    Minutes per session: Not on file  . Stress: Not on file  Relationships  . Social connections:    Talks on phone: Not on file    Gets together: Not on file    Attends religious service: Not on file    Active member of club or organization: Not on file    Attends meetings of clubs or organizations: Not on file    Relationship status: Not on file  . Intimate partner violence:    Fear of current or ex partner: Not on file    Emotionally abused: Not on file    Physically abused: Not on file    Forced sexual activity: Not on file  Other Topics Concern  . Not on file  Social History Narrative  . Not on file      Review of Systems  All other systems reviewed and are negative.      Objective:   Physical Exam  Constitutional: He appears well-developed and well-nourished. No distress.  HENT:  Head: Normocephalic and atraumatic.  Cardiovascular: Normal rate, regular rhythm and normal heart sounds.  Pulmonary/Chest: Effort normal and breath sounds normal. No respiratory distress. He has no wheezes. He has no rales.  Abdominal: Soft. Bowel sounds are normal. He exhibits no distension. There is no tenderness. Hernia confirmed negative in the right inguinal area and confirmed negative in the left inguinal area.  Genitourinary: Prostate normal and penis normal.  Lymphadenopathy:       Right: No inguinal adenopathy present.       Left: No inguinal adenopathy present.  Skin: He is not diaphoretic.  Vitals reviewed.       Assessment & Plan:  OAB (overactive bladder)  Given the fact his PSA was normal, his prostate exam is normal, and terazosin did not help, I think BPH is unlikely to be the cause  of his urinary frequency.  I suspect overactive bladder.  I have given the patient Toviaz 4 mg samples and instructed him to take 1 pill a day and then recheck in 2 to 3 weeks to see if symptoms are improving.  Call me immediately if symptoms worsen.  For his blood pressure I will start the patient on losartan 50 mg a day 2 weeks recheck his blood pressure.  If still elevated I would increase to 100 mg a day.  I reviewed his lab work from his physical exam in March and there was a normal CMP and normal fasting lipid panel, PSA of 1.5, and a normal CBC.

## 2018-08-06 ENCOUNTER — Ambulatory Visit: Payer: BLUE CROSS/BLUE SHIELD | Admitting: Family Medicine

## 2018-08-07 ENCOUNTER — Encounter: Payer: Self-pay | Admitting: Family Medicine
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
## 2018-08-08 MED ORDER — HYDROCHLOROTHIAZIDE 25 MG PO TABS: 25.0000 mg | ORAL_TABLET | Freq: Every day | ORAL | 3 refills | Status: DC

## 2018-09-12 ENCOUNTER — Encounter: Payer: Self-pay | Admitting: Family Medicine

## 2018-09-12 DIAGNOSIS — N3281 Overactive bladder: Secondary | ICD-10-CM

## 2018-09-13 MED ORDER — MIRABEGRON ER 25 MG PO TB24: 25.0000 mg | ORAL_TABLET | Freq: Every day | ORAL | 3 refills | Status: DC

## 2018-10-07 ENCOUNTER — Encounter: Payer: Self-pay | Admitting: Family Medicine

## 2019-01-13 ENCOUNTER — Other Ambulatory Visit: Payer: Self-pay | Admitting: Family Medicine

## 2019-05-17 ENCOUNTER — Other Ambulatory Visit: Payer: Self-pay | Admitting: Family Medicine

## 2019-07-08 ENCOUNTER — Other Ambulatory Visit: Payer: Self-pay | Admitting: Family Medicine

## 2019-07-08 DIAGNOSIS — Z0000 Encounter for general adult medical examination without abnormal findings: Secondary | ICD-10-CM

## 2019-07-08 DIAGNOSIS — Z125 Encounter for screening for malignant neoplasm of prostate: Secondary | ICD-10-CM

## 2019-07-12 ENCOUNTER — Other Ambulatory Visit: Payer: Self-pay

## 2019-07-12 ENCOUNTER — Other Ambulatory Visit: Payer: BC Managed Care – PPO

## 2019-07-12 DIAGNOSIS — Z0000 Encounter for general adult medical examination without abnormal findings: Secondary | ICD-10-CM

## 2019-07-12 DIAGNOSIS — Z125 Encounter for screening for malignant neoplasm of prostate: Secondary | ICD-10-CM

## 2019-07-13 LAB — COMPREHENSIVE METABOLIC PANEL
AG Ratio: 1.6 (calc) (ref 1.0–2.5)
ALT: 22 U/L (ref 9–46)
AST: 30 U/L (ref 10–40)
Albumin: 4.3 g/dL (ref 3.6–5.1)
Alkaline phosphatase (APISO): 43 U/L (ref 36–130)
BUN: 14 mg/dL (ref 7–25)
CO2: 24 mmol/L (ref 20–32)
Calcium: 9.5 mg/dL (ref 8.6–10.3)
Chloride: 106 mmol/L (ref 98–110)
Creat: 1.23 mg/dL (ref 0.60–1.35)
Globulin: 2.7 g/dL (calc) (ref 1.9–3.7)
Glucose, Bld: 90 mg/dL (ref 65–99)
Potassium: 5.1 mmol/L (ref 3.5–5.3)
Sodium: 139 mmol/L (ref 135–146)
Total Bilirubin: 0.9 mg/dL (ref 0.2–1.2)
Total Protein: 7 g/dL (ref 6.1–8.1)

## 2019-07-13 LAB — CBC WITH DIFFERENTIAL/PLATELET
Absolute Monocytes: 269 cells/uL (ref 200–950)
Basophils Absolute: 29 cells/uL (ref 0–200)
Basophils Relative: 0.7 %
Eosinophils Absolute: 29 cells/uL (ref 15–500)
Eosinophils Relative: 0.7 %
HCT: 45.5 % (ref 38.5–50.0)
Hemoglobin: 14.6 g/dL (ref 13.2–17.1)
Lymphs Abs: 1529 cells/uL (ref 850–3900)
MCH: 26.1 pg — ABNORMAL LOW (ref 27.0–33.0)
MCHC: 32.1 g/dL (ref 32.0–36.0)
MCV: 81.3 fL (ref 80.0–100.0)
MPV: 10.9 fL (ref 7.5–12.5)
Monocytes Relative: 6.4 %
Neutro Abs: 2344 cells/uL (ref 1500–7800)
Neutrophils Relative %: 55.8 %
Platelets: 266 10*3/uL (ref 140–400)
RBC: 5.6 10*6/uL (ref 4.20–5.80)
RDW: 14.7 % (ref 11.0–15.0)
Total Lymphocyte: 36.4 %
WBC: 4.2 10*3/uL (ref 3.8–10.8)

## 2019-07-13 LAB — LIPID PANEL
Cholesterol: 201 mg/dL — ABNORMAL HIGH (ref <200)
HDL: 51 mg/dL (ref >=40)
LDL Cholesterol (Calc): 133 mg/dL (calc) — ABNORMAL HIGH
Non-HDL Cholesterol (Calc): 150 mg/dL (calc) — ABNORMAL HIGH (ref <130)
Total CHOL/HDL Ratio: 3.9 (calc) (ref <5.0)
Triglycerides: 78 mg/dL (ref <150)

## 2019-07-13 LAB — PSA: PSA: 1.1 ng/mL (ref <=4.0)

## 2019-07-16 ENCOUNTER — Encounter: Payer: Self-pay | Admitting: Family Medicine

## 2019-07-16 ENCOUNTER — Encounter: Payer: BLUE CROSS/BLUE SHIELD | Admitting: Family Medicine

## 2019-09-03 ENCOUNTER — Other Ambulatory Visit: Payer: Self-pay

## 2019-09-04 ENCOUNTER — Encounter: Payer: BC Managed Care – PPO | Admitting: Family Medicine

## 2019-09-06 ENCOUNTER — Other Ambulatory Visit: Payer: Self-pay | Admitting: Family Medicine

## 2019-10-26 ENCOUNTER — Other Ambulatory Visit: Payer: Self-pay | Admitting: Family Medicine

## 2019-12-11 ENCOUNTER — Ambulatory Visit (INDEPENDENT_AMBULATORY_CARE_PROVIDER_SITE_OTHER): Payer: BC Managed Care – PPO | Admitting: Family Medicine

## 2019-12-11 ENCOUNTER — Encounter: Payer: Self-pay | Admitting: Family Medicine

## 2019-12-11 ENCOUNTER — Other Ambulatory Visit: Payer: Self-pay

## 2019-12-11 VITALS — BP 138/86 | HR 70 | Temp 98.3°F | Resp 14 | Ht 75.0 in | Wt 237.0 lb

## 2019-12-11 DIAGNOSIS — I10 Essential (primary) hypertension: Secondary | ICD-10-CM | POA: Diagnosis not present

## 2019-12-11 DIAGNOSIS — N3281 Overactive bladder: Secondary | ICD-10-CM

## 2019-12-11 DIAGNOSIS — Z125 Encounter for screening for malignant neoplasm of prostate: Secondary | ICD-10-CM | POA: Diagnosis not present

## 2019-12-11 DIAGNOSIS — Z0000 Encounter for general adult medical examination without abnormal findings: Secondary | ICD-10-CM | POA: Diagnosis not present

## 2019-12-11 LAB — URINALYSIS, ROUTINE W REFLEX MICROSCOPIC
Bilirubin Urine: NEGATIVE
Glucose, UA: NEGATIVE
Hgb urine dipstick: NEGATIVE
Ketones, ur: NEGATIVE
Leukocytes,Ua: NEGATIVE
Nitrite: NEGATIVE
Protein, ur: NEGATIVE
Specific Gravity, Urine: 1.025 (ref 1.001–1.03)
pH: 6 (ref 5.0–8.0)

## 2019-12-11 MED ORDER — OMEGA 3 1000 MG PO CAPS: 1.0000 | ORAL_CAPSULE | Freq: Two times a day (BID) | ORAL | Status: DC

## 2019-12-11 MED ORDER — VITAMIN D 25 MCG (1000 UNIT) PO TABS: 1000.0000 [IU] | ORAL_TABLET | Freq: Every day | ORAL | Status: DC

## 2019-12-11 NOTE — Assessment & Plan Note (Signed)
He has been off of his blood pressure medication the past few months.  He is trying to control with diet and exercise.  He will monitor blood pressure at home goal is less than 140/90.

## 2019-12-11 NOTE — Patient Instructions (Addendum)
<   140/90 goal for blood pressure  We will call with results Schedule a follow up with urology  F/U 1 year for physical

## 2019-12-11 NOTE — Progress Notes (Signed)
Subjective:    Patient ID: Aaron Page, male    DOB: September 24, 1971, 49 y.o.   MRN: [id number]  Patient presents for Annual Exam (is fasting)  Pt here for CPE  Medications and history reviewed  Follows with dentist and eye doctor   Immunizations- Flu shot due  But he had reaction twice and felt worse afterwards TDAP UTD   He has overactive bladder issues, he has been tried on multiple medications including Myrbetriq Ditropan Toviaz Detrol with no improvement.  His PSAs have been normal.  There is concerned that he may need neurostimulator because of the overactivity poor response to medications.  His last appointment was in August 2020 with urology then his father fell ill and passed away in the fall.  Back pain he did have a flareup of his chronic back pain.  He had sciatica irritation as well.  He was seen by orthopedics.  He was given epidural injection as well as oral medications which he is no longer on.  He now takes a hemp serum which he feels controls his pain better than all the other medications.    HTN- he has been off the losartan for the past 3 months, he has made dietary chanes, walking more     Review Of Systems:  GEN- denies fatigue, fever, weight loss,weakness, recent illness HEENT- denies eye drainage, change in vision, nasal discharge, CVS- denies chest pain, palpitations RESP- denies SOB, cough, wheeze ABD- denies N/V, change in stools, abd pain GU- denies dysuria, hematuria, dribbling, incontinence MSK- denies joint pain, muscle aches, injury Neuro- denies headache, dizziness, syncope, seizure activity       Objective:    BP 138/86   Pulse 70   Temp 98.3 F (36.8 C) (Temporal)   Resp 14   Ht 6\' 3"  (1.905 m)   Wt 237 lb (107.5 kg)   SpO2 98%   BMI 29.62 kg/m  GEN- NAD, alert and oriented x3 HEENT- PERRL, EOMI, non injected sclera, pink conjunctiva, MMM, oropharynx clear Neck- Supple, no thyromegaly CVS- RRR, no  murmur RESP-CTAB ABD-NABS,soft,NT,ND Psych normal affect and mood  EXT- No edema Pulses- Radial, DP- 2+   Fall/depression/CAGE screen negative     Assessment & Plan:      Problem List Items Addressed This Visit      Unprioritized   Essential hypertension    He has been off of his blood pressure medication the past few months.  He is trying to control with diet and exercise.  He will monitor blood pressure at home goal is less than 140/90.      OAB (overactive bladder)    Recheck urinalysis today along with his renal function and PSA.  At this point he has exhausted multiple medications it seems that they were going to move to a possible neurostimulator.  I would recommend that he go ahead and schedule with urology.      Relevant Orders   Urinalysis, Routine w reflex microscopic (Completed)    Other Visit Diagnoses    Routine general medical examination at a health care facility    -  Primary   CPE done.  Fasting labs obtained.  PSA to be done.  Declines flu shot.   Relevant Orders   CBC with Differential/Platelet   Comprehensive metabolic panel   Lipid panel   Prostate cancer screening       Relevant Orders   PSA      Note: This dictation was prepared with Dragon dictation along  with smaller phrase technology. Any transcriptional errors that result from this process are unintentional.

## 2019-12-11 NOTE — Assessment & Plan Note (Signed)
Recheck urinalysis today along with his renal function and PSA.  At this point he has exhausted multiple medications it seems that they were going to move to a possible neurostimulator.  I would recommend that he go ahead and schedule with urology.

## 2019-12-12 LAB — COMPREHENSIVE METABOLIC PANEL
AG Ratio: 1.6 (calc) (ref 1.0–2.5)
ALT: 31 U/L (ref 9–46)
AST: 40 U/L (ref 10–40)
Albumin: 4.4 g/dL (ref 3.6–5.1)
Alkaline phosphatase (APISO): 49 U/L (ref 36–130)
BUN: 14 mg/dL (ref 7–25)
CO2: 25 mmol/L (ref 20–32)
Calcium: 9.5 mg/dL (ref 8.6–10.3)
Chloride: 106 mmol/L (ref 98–110)
Creat: 1.09 mg/dL (ref 0.60–1.35)
Globulin: 2.7 g/dL (calc) (ref 1.9–3.7)
Glucose, Bld: 93 mg/dL (ref 65–99)
Potassium: 5 mmol/L (ref 3.5–5.3)
Sodium: 140 mmol/L (ref 135–146)
Total Bilirubin: 0.7 mg/dL (ref 0.2–1.2)
Total Protein: 7.1 g/dL (ref 6.1–8.1)

## 2019-12-12 LAB — CBC WITH DIFFERENTIAL/PLATELET
Absolute Monocytes: 289 cells/uL (ref 200–950)
Basophils Absolute: 30 cells/uL (ref 0–200)
Basophils Relative: 0.8 %
Eosinophils Absolute: 38 cells/uL (ref 15–500)
Eosinophils Relative: 1 %
HCT: 45.8 % (ref 38.5–50.0)
Hemoglobin: 14.6 g/dL (ref 13.2–17.1)
Lymphs Abs: 1535 cells/uL (ref 850–3900)
MCH: 25.8 pg — ABNORMAL LOW (ref 27.0–33.0)
MCHC: 31.9 g/dL — ABNORMAL LOW (ref 32.0–36.0)
MCV: 81.1 fL (ref 80.0–100.0)
MPV: 10.6 fL (ref 7.5–12.5)
Monocytes Relative: 7.6 %
Neutro Abs: 1908 cells/uL (ref 1500–7800)
Neutrophils Relative %: 50.2 %
Platelets: 279 10*3/uL (ref 140–400)
RBC: 5.65 10*6/uL (ref 4.20–5.80)
RDW: 14.2 % (ref 11.0–15.0)
Total Lymphocyte: 40.4 %
WBC: 3.8 10*3/uL (ref 3.8–10.8)

## 2019-12-12 LAB — LIPID PANEL
Cholesterol: 184 mg/dL (ref <200)
HDL: 52 mg/dL (ref >=40)
LDL Cholesterol (Calc): 113 mg/dL (calc) — ABNORMAL HIGH
Non-HDL Cholesterol (Calc): 132 mg/dL (calc) — ABNORMAL HIGH (ref <130)
Total CHOL/HDL Ratio: 3.5 (calc) (ref <5.0)
Triglycerides: 90 mg/dL (ref <150)

## 2019-12-12 LAB — PSA: PSA: 1 ng/mL (ref <=4.0)

## 2019-12-13 ENCOUNTER — Encounter: Payer: Self-pay | Admitting: Family Medicine

## 2019-12-16 ENCOUNTER — Other Ambulatory Visit: Payer: Self-pay | Admitting: *Deleted

## 2019-12-16 DIAGNOSIS — Z113 Encounter for screening for infections with a predominantly sexual mode of transmission: Secondary | ICD-10-CM

## 2020-02-25 ENCOUNTER — Other Ambulatory Visit: Payer: Self-pay

## 2020-02-25 ENCOUNTER — Encounter: Payer: Self-pay | Admitting: Family Medicine

## 2020-02-25 ENCOUNTER — Telehealth (INDEPENDENT_AMBULATORY_CARE_PROVIDER_SITE_OTHER): Payer: BC Managed Care – PPO | Admitting: Family Medicine

## 2020-02-25 DIAGNOSIS — U071 COVID-19: Secondary | ICD-10-CM

## 2020-02-25 NOTE — Progress Notes (Signed)
Virtual Visit via Video Note  I connected with Aaron Page on 02/25/20 at 8:03am  by a video enabled telemedicine application and verified that I am speaking with the correct person using two identifiers.      Pt location: at home   Physician location:  In office, [facility], Vic Blackbird MD     On call: patient and physician   I discussed the limitations of evaluation and management by telemedicine and the availability of in person appointments. The patient expressed understanding and agreed to proceed.  History of Present Illness: Patient diagnosed with COVID-19 infection on February 15, 2020.  His daughter had COVID-19 prior to him.  He took two test with a rapid and PCR test.  He started with body aches mild cough congestion headache, then  Fever with sweats.  He has been trying to push fluids however he has fairly overactive bladder issue which urology is following and he urinates large amount every day.  Unfortunately he became dehydrated and weak was seen at the emergency room on April 16.  Chest x-ray was negative EKG unremarkable labs were fairly unremarkable his creatinine was 1.22.  He was given IV fluids and advised to continue with over-the-counter medications to treat symptoms of COVID-19 infection along with vitamin C and zinc.  He continues to have night sweats and body aches.  He was supposed to return to work tomorrow April 21.  Denies any difficulty breathing.  Appetite has been improving.  He has small amounts of loose stool but no significant abdominal pain.   Observations/Objective: Patient able to visualize me however I was unable to visualize him.  Audio was working.  He did have hoarse voice but normal work of breathing able to speak in full sentences.  Assessment and Plan: COVID-19 infection continue with supportive care pushing fluids eating or his appetite.  He will continue with over-the-counter cough and cold medicines as needed and acetaminophen  for fever.  States he is still having symptoms of COVID-19 I do not recommend him going back into the workplace tomorrow.  I will extend his time out of work where he will return on Monday the 26. His note will cover him from April 9 through Sunday the 25th.  His HR can forward me any other paperwork needed such as FMLA or short-term disability  Follow Up Instructions:    I discussed the assessment and treatment plan with the patient. The patient was provided an opportunity to ask questions and all were answered. The patient agreed with the plan and demonstrated an understanding of the instructions.   The patient was advised to call back or seek an in-person evaluation if the symptoms worsen or if the condition fails to improve as anticipated.  I provided  16 minutes of non-face-to-face time during this encounter. End Time: 8:19am   Vic Blackbird, MD

## 2020-02-28 ENCOUNTER — Other Ambulatory Visit: Payer: Self-pay | Admitting: Family Medicine

## 2020-03-27 ENCOUNTER — Other Ambulatory Visit: Payer: Self-pay | Admitting: Family Medicine

## 2020-04-17 ENCOUNTER — Encounter: Payer: Self-pay | Admitting: Family Medicine

## 2020-07-28 ENCOUNTER — Other Ambulatory Visit: Payer: BC Managed Care – PPO

## 2020-07-28 ENCOUNTER — Other Ambulatory Visit: Payer: Self-pay

## 2020-07-28 DIAGNOSIS — Z113 Encounter for screening for infections with a predominantly sexual mode of transmission: Secondary | ICD-10-CM

## 2020-07-29 ENCOUNTER — Encounter: Payer: Self-pay | Admitting: Family Medicine

## 2020-07-29 LAB — C. TRACHOMATIS/N. GONORRHOEAE RNA
C. trachomatis RNA, TMA: NOT DETECTED
N. gonorrhoeae RNA, TMA: NOT DETECTED

## 2020-07-29 LAB — HSV(HERPES SIMPLEX VRS) I + II AB-IGG
HAV 1 IGG,TYPE SPECIFIC AB: 19.7 index — ABNORMAL HIGH
HSV 2 IGG,TYPE SPECIFIC AB: 6.37 index — ABNORMAL HIGH

## 2020-07-29 LAB — RPR: RPR Ser Ql: NONREACTIVE

## 2020-07-29 LAB — HIV ANTIBODY (ROUTINE TESTING W REFLEX): HIV 1&2 Ab, 4th Generation: NONREACTIVE

## 2020-08-07 ENCOUNTER — Other Ambulatory Visit: Payer: Self-pay | Admitting: Family Medicine

## 2020-10-06 ENCOUNTER — Other Ambulatory Visit: Payer: Self-pay | Admitting: Family Medicine

## 2020-11-02 ENCOUNTER — Other Ambulatory Visit: Payer: Self-pay | Admitting: Family Medicine

## 2020-12-11 ENCOUNTER — Encounter: Payer: BC Managed Care – PPO | Admitting: Family Medicine

## 2020-12-14 ENCOUNTER — Other Ambulatory Visit: Payer: Self-pay

## 2020-12-14 ENCOUNTER — Encounter: Payer: Self-pay | Admitting: Nurse Practitioner

## 2020-12-14 ENCOUNTER — Telehealth: Payer: Self-pay

## 2020-12-14 ENCOUNTER — Ambulatory Visit (INDEPENDENT_AMBULATORY_CARE_PROVIDER_SITE_OTHER): Payer: BC Managed Care – PPO | Admitting: Nurse Practitioner

## 2020-12-14 VITALS — BP 122/82 | HR 63 | Temp 98.5°F | Ht 75.0 in | Wt 225.0 lb

## 2020-12-14 DIAGNOSIS — I10 Essential (primary) hypertension: Secondary | ICD-10-CM

## 2020-12-14 DIAGNOSIS — F411 Generalized anxiety disorder: Secondary | ICD-10-CM

## 2020-12-14 DIAGNOSIS — M545 Low back pain, unspecified: Secondary | ICD-10-CM

## 2020-12-14 DIAGNOSIS — Z8639 Personal history of other endocrine, nutritional and metabolic disease: Secondary | ICD-10-CM | POA: Diagnosis not present

## 2020-12-14 DIAGNOSIS — Z0001 Encounter for general adult medical examination with abnormal findings: Secondary | ICD-10-CM

## 2020-12-14 DIAGNOSIS — Z1159 Encounter for screening for other viral diseases: Secondary | ICD-10-CM | POA: Diagnosis not present

## 2020-12-14 DIAGNOSIS — Z125 Encounter for screening for malignant neoplasm of prostate: Secondary | ICD-10-CM

## 2020-12-14 DIAGNOSIS — N3281 Overactive bladder: Secondary | ICD-10-CM

## 2020-12-14 DIAGNOSIS — Z6829 Body mass index (BMI) 29.0-29.9, adult: Secondary | ICD-10-CM | POA: Diagnosis not present

## 2020-12-14 DIAGNOSIS — G8929 Other chronic pain: Secondary | ICD-10-CM

## 2020-12-14 DIAGNOSIS — H6121 Impacted cerumen, right ear: Secondary | ICD-10-CM

## 2020-12-14 DIAGNOSIS — Z0000 Encounter for general adult medical examination without abnormal findings: Secondary | ICD-10-CM

## 2020-12-14 DIAGNOSIS — L308 Other specified dermatitis: Secondary | ICD-10-CM

## 2020-12-14 MED ORDER — OMEGA 3 1000 MG PO CAPS: 1.0000 | ORAL_CAPSULE | Freq: Two times a day (BID) | ORAL | 3 refills | Status: DC

## 2020-12-14 NOTE — Progress Notes (Signed)
BP 122/82   Pulse 63   Temp 98.5 F (36.9 C) (Oral)   Ht 6\' 3"  (1.905 m)   Wt 225 lb (102.1 kg)   SpO2 100%   BMI 28.12 kg/m    Subjective:    Patient ID: Aaron Page, male    DOB: 1971/04/09, 50 y.o.   MRN: [id number]  HPI:  Aaron Page is a 50 y.o. male presenting on 12/14/2020 for comprehensive medical examination. Current medical complaints include:none  CHRONIC BACK PAIN Reports has a herniated disc in between L4-L5; has reportedly received 5 steroid injections with Orthopedic Surgeon, Dr. Megan Salon, in [place] at [facility].  Takes gabapentin and extra strength ibuprofen for this pain when it flares.  GENERALIZED ANXIETY DISORDER Reports was on medication in the past, now reaches out to Counselor - Lilli Few [facility], [place]) as needed for counseling.  Feels very connected with this counselor and does not feel he needs medication at this time.  GAD 7 : Generalized Anxiety Score 12/14/2020  Nervous, Anxious, on Edge 0  Control/stop worrying 0  Worry too much - different things 0  Trouble relaxing 0  Restless 0  Easily annoyed or irritable 1  Afraid - awful might happen 0  Total GAD 7 Score 1  Anxiety Difficulty Not difficult at all   OVERACTIVE BLADDER Has seen Urology in the past for OAB - last in Feb. 2021 (Dr. Diona Fanti primary Urologist).  Has tried medication in past which reports were not beneficial.  States his symptoms come and go with his back pain flares and thinks they may be connected.  Planning to reach out to Urologist if symptoms return or worsen.   Also reports he used to be on medication for high blood pressure, he has not needed any in quite some time.  Has intentionally lost ~16 pounds in the past year.  Interim Problems from his last visit: no  Depression Screen done today and results listed below:  Depression screen [facility] 2/9 12/14/2020 12/11/2019 08/03/2018 02/07/2018 03/25/2015  Decreased Interest 0 0 0 1 0  Down, Depressed, Hopeless 0 0 0 1 1  PHQ  - 2 Score 0 0 0 2 1  Altered sleeping - 1 - 2 1  Tired, decreased energy - 0 - 2 1  Change in appetite - 0 - 0 0  Feeling bad or failure about yourself  - 0 - 0 1  Trouble concentrating - 0 - 0 0  Moving slowly or fidgety/restless - 0 - 0 0  Suicidal thoughts - 0 - 0 0  PHQ-9 Score - 1 - 6 4  Difficult doing work/chores - Not difficult at all - Not difficult at all Somewhat difficult    The patient does not have a history of falls. I did not complete a risk assessment for falls. A plan of care for falls was not documented.  Past Medical History:  Past Medical History:  Diagnosis Date  . GAD (generalized anxiety disorder) 02/15/2016  . Hypertension   . Inguinal hernia 03/25/2015  . Sickle cell trait [facility])     Surgical History:  No past surgical history on file.  Medications:  Current Outpatient Medications on File Prior to Visit  Medication Sig  . cholecalciferol (VITAMIN D3) 25 MCG (1000 UNIT) tablet Take 1 tablet (1,000 Units total) by mouth daily.  Marland Kitchen gabapentin (NEURONTIN) 300 MG capsule TAKE 1 CAPSULE BY MOUTH AT BEDTIME AND INCREASE AS TOLERATED 3 TIMES A DAY  . ibuprofen (ADVIL,MOTRIN) 800 MG tablet  TAKE 1 TABLET BY MOUTH 3 TIMES A DAY WITH FOOD  . triamcinolone (KENALOG) 0.1 % APPLY TO AFFECTED AREA TWICE A DAY  . UNABLE TO FIND Hemp Oil/ CBD- back pain   No current facility-administered medications on file prior to visit.    Allergies:  Allergies  Allergen Reactions  . Penicillins Nausea And Vomiting and Rash    Social History:  Social History   Socioeconomic History  . Marital status: Single    Spouse name: Not on file  . Number of children: Not on file  . Years of education: Not on file  . Highest education level: Not on file  Occupational History  . Not on file  Tobacco Use  . Smoking status: Never Smoker  . Smokeless tobacco: Never Used  Vaping Use  . Vaping Use: Never used  Substance and Sexual Activity  . Alcohol use: Yes    Alcohol/week: 6.0  standard drinks    Types: 2 Glasses of wine, 2 Cans of beer, 2 Shots of liquor per week  . Drug use: No  . Sexual activity: Yes  Other Topics Concern  . Not on file  Social History Narrative  . Not on file   Social Determinants of Health   Financial Resource Strain: Not on file  Food Insecurity: Not on file  Transportation Needs: Not on file  Physical Activity: Not on file  Stress: Not on file  Social Connections: Not on file  Intimate Partner Violence: Not on file   Social History   Tobacco Use  Smoking Status Never Smoker  Smokeless Tobacco Never Used   Social History   Substance and Sexual Activity  Alcohol Use Yes  . Alcohol/week: 6.0 standard drinks  . Types: 2 Glasses of wine, 2 Cans of beer, 2 Shots of liquor per week    Family History:  Family History  Problem Relation Age of Onset  . COPD Father   . Heart disease Father   . Heart failure Father     Past medical history, surgical history, medications, allergies, family history and social history reviewed with patient today and changes made to appropriate areas of the chart.   Review of Systems  Constitutional: Negative.  Negative for chills, fever, malaise/fatigue and weight loss.  HENT: Negative.  Negative for congestion, hearing loss, sinus pain, sore throat and tinnitus.   Eyes: Negative.  Negative for blurred vision and double vision.  Respiratory: Negative.  Negative for cough, hemoptysis, shortness of breath and wheezing.   Cardiovascular: Negative.  Negative for chest pain and palpitations.  Gastrointestinal: Negative.  Negative for blood in stool, constipation, diarrhea, heartburn, nausea and vomiting.  Genitourinary: Negative.  Negative for dysuria and urgency.  Musculoskeletal: Negative.  Negative for joint pain and myalgias.  Skin: Negative.  Negative for itching and rash.  Neurological: Negative.  Negative for dizziness, tingling, weakness and headaches.  Psychiatric/Behavioral: Negative.   Negative for depression. The patient is not nervous/anxious and does not have insomnia.       Objective:    BP 122/82   Pulse 63   Temp 98.5 F (36.9 C) (Oral)   Ht 6\' 3"  (1.905 m)   Wt 225 lb (102.1 kg)   SpO2 100%   BMI 28.12 kg/m   Wt Readings from Last 3 Encounters:  12/14/20 225 lb (102.1 kg)  12/11/19 237 lb (107.5 kg)  08/03/18 221 lb (100.2 kg)    Physical Exam Vitals and nursing note reviewed.  Constitutional:  General: He is not in acute distress.    Appearance: Normal appearance. He is normal weight. He is not toxic-appearing.  HENT:     Head: Normocephalic and atraumatic.     Right Ear: Ear canal and external ear normal. There is impacted cerumen.     Left Ear: Tympanic membrane, ear canal and external ear normal.     Nose: Nose normal. No congestion.     Mouth/Throat:     Mouth: Mucous membranes are moist.     Pharynx: Oropharynx is clear. No oropharyngeal exudate or posterior oropharyngeal erythema.  Eyes:     General: No scleral icterus.    Extraocular Movements: Extraocular movements intact.     Pupils: Pupils are equal, round, and reactive to light.  Cardiovascular:     Rate and Rhythm: Normal rate and regular rhythm.     Heart sounds: Normal heart sounds. No murmur heard. No gallop.   Pulmonary:     Effort: Pulmonary effort is normal. No respiratory distress.     Breath sounds: Normal breath sounds. No wheezing or rhonchi.  Abdominal:     General: Abdomen is flat. Bowel sounds are normal. There is no distension.     Palpations: Abdomen is soft.     Tenderness: There is no abdominal tenderness.  Genitourinary:    Comments: Deferred using shared decision making Musculoskeletal:        General: No swelling or tenderness. Normal range of motion.     Cervical back: Normal range of motion and neck supple. No tenderness.     Right lower leg: No edema.     Left lower leg: No edema.  Lymphadenopathy:     Cervical: No cervical adenopathy.  Skin:     General: Skin is warm and dry.     Capillary Refill: Capillary refill takes less than 2 seconds.     Coloration: Skin is not jaundiced or pale.     Findings: No erythema.  Neurological:     Mental Status: He is alert and oriented to person, place, and time.     Motor: No weakness.     Gait: Gait normal.  Psychiatric:        Mood and Affect: Mood normal.        Behavior: Behavior normal.        Thought Content: Thought content normal.        Judgment: Judgment normal.       Assessment & Plan:   Problem List Items Addressed This Visit      Cardiovascular and Mediastinum   Essential hypertension    Chronic, stable off medication.  BP at goal today in clinic.  Encouraged patient to monitor BP at home and notify if >130/80 at home.          Musculoskeletal and Integument   Eczema    Chronic, stable.  Flares occur on dorsal aspects of hands, finds it hard to keep triamcinolone on hands when washing them/using sanitizer during the winter months.  Encouraged twice daily emollient in morning right after shower and before bed and to use steroid cream sparingly right after.        Genitourinary   OAB (overactive bladder)    Chronic, stable.  Encouraged to reach out to Urology with any persistence of these symptoms.  PSA checked today.        Other   BMI 29.0-29.9,adult    Discussed dietary and lifestyle changes.  Goal exercise is 150 minutes per week or  30 minutes 5 times weekly.  Lipids, HgbA1c checked today.      Chronic back pain    Chronic, stable.  Follows with Orthopedic Surgeon in [place], [place].  Continue this collaboration and seldom use of gabapentin and ibuprofen.  Encouraged weekly exercise goal of 150 minutes or 30 minutes 5 times weekly.      Generalized anxiety disorder    Chronic, stable without pharmacologic intervention.  Continue counseling with therapist.  GAD-7 and PHQ-9 not elevated today.  Follow up if symptoms return and desires medication.       Other  Visit Diagnoses    Annual physical exam    -  Primary   Relevant Orders   COMPLETE METABOLIC PANEL WITH GFR   Lipid panel   PSA   CBC with Differential/Platelet   VITAMIN D 25 Hydroxy (Vit-D Deficiency, Fractures)   Need for hepatitis C screening test       Relevant Orders   Hepatitis C antibody   History of vitamin D deficiency       Relevant Orders   VITAMIN D 25 Hydroxy (Vit-D Deficiency, Fractures)   History of hyperlipidemia       Relevant Medications   Omega 3 1000 MG CAPS   Other Relevant Orders   COMPLETE METABOLIC PANEL WITH GFR   Lipid panel   Screening for prostate cancer       Relevant Orders   PSA   Impacted cerumen of right ear       Encouraged use of OTC drops like Debrox to help loosen wax.       Discussed aspirin prophylaxis for myocardial infarction prevention and decision was it was not indicated  LABORATORY TESTING:  Health maintenance labs ordered today as discussed above.   The natural history of prostate cancer and ongoing controversy regarding screening and potential treatment outcomes of prostate cancer has been discussed with the patient. The meaning of a false positive PSA and a false negative PSA has been discussed. He indicates understanding of the limitations of this screening test and wishes to proceed with screening PSA testing.   IMMUNIZATIONS:   - Tdap: Tetanus vaccination status reviewed: last tetanus booster within 10 years. - Influenza: Refused - Pneumovax: Not applicable - Prevnar: Not applicable - HPV: Not applicable - Zostavax vaccine: Not applicable - [id number] vaccine: Refused  SCREENING: - Colonoscopy: done elsewhere - going to request records  Discussed with patient purpose of the colonoscopy is to detect colon cancer at curable precancerous or early stages   - AAA Screening: Not applicable  -Hearing Test: Not applicable  -Spirometry: Not applicable   PATIENT COUNSELING:    Sexuality: Discussed sexually transmitted  diseases, partner selection, use of condoms, avoidance of unintended pregnancy  and contraceptive alternatives.   Advised to avoid cigarette smoking.  I discussed with the patient that most people either abstain from alcohol or drink within safe limits (<=14/week and <=4 drinks/occasion for males, <=7/weeks and <= 3 drinks/occasion for females) and that the risk for alcohol disorders and other health effects rises proportionally with the number of drinks per week and how often a drinker exceeds daily limits.  Discussed cessation/primary prevention of drug use and availability of treatment for abuse.   Diet: Encouraged to adjust caloric intake to maintain  or achieve ideal body weight, to reduce intake of dietary saturated fat and total fat, to limit sodium intake by avoiding high sodium foods and not adding table salt, and to maintain adequate dietary potassium and calcium  preferably from fresh fruits, vegetables, and low-fat dairy products.    stressed the importance of regular exercise  Injury prevention: Discussed safety belts, safety helmets, smoke detector, smoking near bedding or upholstery.   Dental health: Discussed importance of regular tooth brushing, flossing, and dental visits.   Follow up plan: NEXT PREVENTATIVE PHYSICAL DUE IN 1 YEAR. Return in about 1 year (around 12/14/2021) for CPE with fasting labs 1 week prior.

## 2020-12-14 NOTE — Assessment & Plan Note (Addendum)
Discussed dietary and lifestyle changes.  Goal exercise is 150 minutes per week or 30 minutes 5 times weekly.  Lipids, HgbA1c checked today.

## 2020-12-14 NOTE — Assessment & Plan Note (Signed)
Chronic, stable without pharmacologic intervention.  Continue counseling with therapist.  GAD-7 and PHQ-9 not elevated today.  Follow up if symptoms return and desires medication.

## 2020-12-14 NOTE — Telephone Encounter (Signed)
Records requested from [place] gastro for continuity of care

## 2020-12-14 NOTE — Assessment & Plan Note (Signed)
Chronic, stable.  Follows with Orthopedic Surgeon in [place], [place].  Continue this collaboration and seldom use of gabapentin and ibuprofen.  Encouraged weekly exercise goal of 150 minutes or 30 minutes 5 times weekly.

## 2020-12-14 NOTE — Patient Instructions (Signed)
Labs tomorrow morning  F/U 1 year for physical We will request records from GI doctor   Preventive Care 51-51 Years Old, Male Preventive care refers to lifestyle choices and visits with your health care provider that can promote health and wellness. This includes:  A yearly physical exam. This is also called an annual wellness visit.  Regular dental and eye exams.  Immunizations.  Screening for certain conditions.  Healthy lifestyle choices, such as: ? Eating a healthy diet. ? Getting regular exercise. ? Not using drugs or products that contain nicotine and tobacco. ? Limiting alcohol use. What can I expect for my preventive care visit? Physical exam Your health care provider will check your:  Height and weight. These may be used to calculate your BMI (body mass index). BMI is a measurement that tells if you are at a healthy weight.  Heart rate and blood pressure.  Body temperature.  Skin for abnormal spots. Counseling Your health care provider may ask you questions about your:  Past medical problems.  Family's medical history.  Alcohol, tobacco, and drug use.  Emotional well-being.  Home life and relationship well-being.  Sexual activity.  Diet, exercise, and sleep habits.  Work and work Statistician.  Access to firearms. What immunizations do I need? Vaccines are usually given at various ages, according to a schedule. Your health care provider will recommend vaccines for you based on your age, medical history, and lifestyle or other factors, such as travel or where you work.   What tests do I need? Blood tests  Lipid and cholesterol levels. These may be checked every 5 years, or more often if you are over 51 years old.  Hepatitis C test.  Hepatitis B test. Screening  Lung cancer screening. You may have this screening every year starting at age 51 if you have a 30-pack-year history of smoking and currently smoke or have quit within the past 15  years.  Prostate cancer screening. Recommendations will vary depending on your family history and other risks.  Genital exam to check for testicular cancer or hernias.  Colorectal cancer screening. ? All adults should have this screening starting at age 51 and continuing until age 65. ? Your health care provider may recommend screening at age 51 if you are at increased risk. ? You will have tests every 1-10 years, depending on your results and the type of screening test.  Diabetes screening. ? This is done by checking your blood sugar (glucose) after you have not eaten for a while (fasting). ? You may have this done every 1-3 years.  STD (sexually transmitted disease) testing, if you are at risk. Follow these instructions at home: Eating and drinking  Eat a diet that includes fresh fruits and vegetables, whole grains, lean protein, and low-fat dairy products.  Take vitamin and mineral supplements as recommended by your health care provider.  Do not drink alcohol if your health care provider tells you not to drink.  If you drink alcohol: ? Limit how much you have to 0-2 drinks a day. ? Be aware of how much alcohol is in your drink. In the [place] one drink equals one 12 oz bottle of beer (355 mL), one 5 oz glass of wine (148 mL), or one 1 oz glass of hard liquor (44 mL).   Lifestyle  Take daily care of your teeth and gums. Brush your teeth every morning and night with fluoride toothpaste. Floss one time each day.  Stay active. Exercise for at least  30 minutes 5 or more days each week.  Do not use any products that contain nicotine or tobacco, such as cigarettes, e-cigarettes, and chewing tobacco. If you need help quitting, ask your health care provider.  Do not use drugs.  If you are sexually active, practice safe sex. Use a condom or other form of protection to prevent STIs (sexually transmitted infections).  If told by your health care provider, take low-dose aspirin daily  starting at age 40.  Find healthy ways to cope with stress, such as: ? Meditation, yoga, or listening to music. ? Journaling. ? Talking to a trusted person. ? Spending time with friends and family. Safety  Always wear your seat belt while driving or riding in a vehicle.  Do not drive: ? If you have been drinking alcohol. Do not ride with someone who has been drinking. ? When you are tired or distracted. ? While texting.  Wear a helmet and other protective equipment during sports activities.  If you have firearms in your house, make sure you follow all gun safety procedures. What's next?  Go to your health care provider once a year for an annual wellness visit.  Ask your health care provider how often you should have your eyes and teeth checked.  Stay up to date on all vaccines. This information is not intended to replace advice given to you by your health care provider. Make sure you discuss any questions you have with your health care provider. Document Revised: 07/23/2019 Document Reviewed: 10/18/2018 Elsevier Patient Education  2021 [employer].

## 2020-12-14 NOTE — Assessment & Plan Note (Signed)
Chronic, stable off medication.  BP at goal today in clinic.  Encouraged patient to monitor BP at home and notify if >130/80 at home.

## 2020-12-14 NOTE — Assessment & Plan Note (Signed)
Chronic, stable.  Flares occur on dorsal aspects of hands, finds it hard to keep triamcinolone on hands when washing them/using sanitizer during the winter months.  Encouraged twice daily emollient in morning right after shower and before bed and to use steroid cream sparingly right after.

## 2020-12-14 NOTE — Assessment & Plan Note (Signed)
Chronic, stable.  Encouraged to reach out to Urology with any persistence of these symptoms.  PSA checked today.

## 2020-12-15 ENCOUNTER — Other Ambulatory Visit: Payer: BC Managed Care – PPO

## 2020-12-15 DIAGNOSIS — Z8639 Personal history of other endocrine, nutritional and metabolic disease: Secondary | ICD-10-CM

## 2020-12-15 DIAGNOSIS — Z1159 Encounter for screening for other viral diseases: Secondary | ICD-10-CM

## 2020-12-15 DIAGNOSIS — Z125 Encounter for screening for malignant neoplasm of prostate: Secondary | ICD-10-CM

## 2020-12-15 DIAGNOSIS — Z0000 Encounter for general adult medical examination without abnormal findings: Secondary | ICD-10-CM

## 2020-12-16 LAB — LIPID PANEL
Cholesterol: 172 mg/dL (ref <200)
HDL: 56 mg/dL (ref >=40)
LDL Cholesterol (Calc): 102 mg/dL (calc) — ABNORMAL HIGH
Non-HDL Cholesterol (Calc): 116 mg/dL (calc) (ref <130)
Total CHOL/HDL Ratio: 3.1 (calc) (ref <5.0)
Triglycerides: 59 mg/dL (ref <150)

## 2020-12-16 LAB — CBC WITH DIFFERENTIAL/PLATELET
Absolute Monocytes: 293 cells/uL (ref 200–950)
Basophils Absolute: 30 cells/uL (ref 0–200)
Basophils Relative: 0.8 %
Eosinophils Absolute: 30 cells/uL (ref 15–500)
Eosinophils Relative: 0.8 %
HCT: 43.6 % (ref 38.5–50.0)
Hemoglobin: 14.1 g/dL (ref 13.2–17.1)
Lymphs Abs: 1379 cells/uL (ref 850–3900)
MCH: 26 pg — ABNORMAL LOW (ref 27.0–33.0)
MCHC: 32.3 g/dL (ref 32.0–36.0)
MCV: 80.3 fL (ref 80.0–100.0)
MPV: 11.1 fL (ref 7.5–12.5)
Monocytes Relative: 7.7 %
Neutro Abs: 2067 cells/uL (ref 1500–7800)
Neutrophils Relative %: 54.4 %
Platelets: 274 10*3/uL (ref 140–400)
RBC: 5.43 10*6/uL (ref 4.20–5.80)
RDW: 14.7 % (ref 11.0–15.0)
Total Lymphocyte: 36.3 %
WBC: 3.8 10*3/uL (ref 3.8–10.8)

## 2020-12-16 LAB — COMPLETE METABOLIC PANEL WITH GFR
AG Ratio: 1.6 (calc) (ref 1.0–2.5)
ALT: 18 U/L (ref 9–46)
AST: 23 U/L (ref 10–35)
Albumin: 4.1 g/dL (ref 3.6–5.1)
Alkaline phosphatase (APISO): 47 U/L (ref 35–144)
BUN: 13 mg/dL (ref 7–25)
CO2: 25 mmol/L (ref 20–32)
Calcium: 9.3 mg/dL (ref 8.6–10.3)
Chloride: 107 mmol/L (ref 98–110)
Creat: 0.89 mg/dL (ref 0.70–1.33)
GFR, Est African American: 116 mL/min/{1.73_m2} (ref >=60)
GFR, Est Non African American: 100 mL/min/{1.73_m2} (ref >=60)
Globulin: 2.6 g/dL (calc) (ref 1.9–3.7)
Glucose, Bld: 90 mg/dL (ref 65–99)
Potassium: 4.7 mmol/L (ref 3.5–5.3)
Sodium: 142 mmol/L (ref 135–146)
Total Bilirubin: 0.5 mg/dL (ref 0.2–1.2)
Total Protein: 6.7 g/dL (ref 6.1–8.1)

## 2020-12-16 LAB — PSA: PSA: 1.29 ng/mL (ref <=4.0)

## 2020-12-16 LAB — HEPATITIS C ANTIBODY
Hepatitis C Ab: NONREACTIVE
SIGNAL TO CUT-OFF: 0.01 (ref <1.00)

## 2020-12-16 LAB — VITAMIN D 25 HYDROXY (VIT D DEFICIENCY, FRACTURES): Vit D, 25-Hydroxy: 21 ng/mL — ABNORMAL LOW (ref 30–100)

## 2021-01-21 ENCOUNTER — Encounter: Payer: Self-pay | Admitting: Nurse Practitioner

## 2021-01-21 DIAGNOSIS — Z1211 Encounter for screening for malignant neoplasm of colon: Secondary | ICD-10-CM

## 2021-01-26 ENCOUNTER — Encounter: Payer: Self-pay | Admitting: Internal Medicine

## 2021-03-09 ENCOUNTER — Ambulatory Visit: Payer: BLUE CROSS/BLUE SHIELD | Admitting: Nurse Practitioner

## 2021-03-11 ENCOUNTER — Other Ambulatory Visit: Payer: Self-pay | Admitting: Family Medicine

## 2021-04-28 ENCOUNTER — Encounter: Payer: Self-pay | Admitting: Internal Medicine

## 2021-04-28 ENCOUNTER — Ambulatory Visit: Payer: Self-pay | Admitting: Internal Medicine

## 2021-05-19 ENCOUNTER — Other Ambulatory Visit: Payer: Self-pay | Admitting: Family Medicine

## 2021-09-25 ENCOUNTER — Other Ambulatory Visit: Payer: Self-pay | Admitting: Nurse Practitioner

## 2021-09-27 ENCOUNTER — Encounter: Payer: Self-pay | Admitting: Gastroenterology

## 2021-09-27 ENCOUNTER — Ambulatory Visit: Payer: BC Managed Care – PPO | Admitting: Gastroenterology

## 2021-09-27 ENCOUNTER — Other Ambulatory Visit: Payer: Self-pay

## 2021-09-27 VITALS — BP 147/82 | HR 72 | Temp 97.1°F | Ht 75.0 in | Wt 220.6 lb

## 2021-09-27 DIAGNOSIS — K5900 Constipation, unspecified: Secondary | ICD-10-CM | POA: Diagnosis not present

## 2021-09-27 DIAGNOSIS — Z1211 Encounter for screening for malignant neoplasm of colon: Secondary | ICD-10-CM | POA: Diagnosis not present

## 2021-09-27 MED ORDER — IBUPROFEN 800 MG PO TABS: 800.0000 mg | ORAL_TABLET | Freq: Once | ORAL | 1 refills | Status: AC

## 2021-09-27 NOTE — Patient Instructions (Signed)
Try Miralax one capful twice daily until good bowel movements, then back down to once daily as needed.  Colonoscopy as scheduled. See separate instructions.

## 2021-09-27 NOTE — Progress Notes (Signed)
Primary Care Physician:  Eulogio Bear, NP  Primary Gastroenterologist:  Elon Alas. Abbey Chatters, DO   Chief Complaint  Patient presents with   Constipation    Low back pain, relieved with having bm   Colonoscopy    Had tcs/egd approx 10 yrs ago in [place]. History of IBS    HPI:  Aaron Page is a 50 y.o. male here at the request of Noemi Chapel, NP for consideration of colonoscopy.  Patient had a colonoscopy about 10 years ago when he was having a lot of diarrhea.  Diagnosed with IBS.  Denies any personal history of colon polyps.  Diarrhea resolved after he got out of a bad relationship.  He also has a history of overactive bladder, evaluated by [facility] in the past.  He is having about 1 stool daily, sometimes 2.  Although feels like stools are incomplete at times.  He has had some low back pain, right of midline, which seems to improve after having a good bowel movement.  He does have L4-L5 issues, recently was on steroids for this.  He is not sure if the pain he has in his back that is related by his BMs are related to his back for constipation.  He is drinking warm prune juice daily to help.  Denies melena or rectal bleeding.  No weight loss.  No history of significant alcohol use.  30 years ago when he was in the [employer] he drank more regular basis but never abused alcohol.     Current Outpatient Medications  Medication Sig Dispense Refill   cholecalciferol (VITAMIN D3) 25 MCG (1000 UNIT) tablet Take 1 tablet (1,000 Units total) by mouth daily.     gabapentin (NEURONTIN) 300 MG capsule Take 300 mg by mouth as needed.     ibuprofen (ADVIL) 800 MG tablet Take 1 tablet (800 mg total) by mouth once for 1 dose. (Patient taking differently: Take 800 mg by mouth as needed.) 30 tablet 1   Omega 3 1000 MG CAPS Take 1 capsule (1,000 mg total) by mouth 2 (two) times daily. (Patient taking differently: Take 1 capsule by mouth daily.) 180 capsule 3   triamcinolone cream  (KENALOG) 0.1 % APPLY TO AFFECTED AREA TWICE A DAY (Patient taking differently: as needed. APPLY TO AFFECTED AREA TWICE A DAY) 30 g 3   No current facility-administered medications for this visit.    Allergies as of 09/27/2021 - Review Complete 09/27/2021  Allergen Reaction Noted   Penicillins Nausea And Vomiting and Rash 03/19/2015    Past Medical History:  Diagnosis Date   GAD (generalized anxiety disorder) 02/15/2016   Hypertension    Inguinal hernia 03/25/2015   Sickle cell trait ([place])     History reviewed. No pertinent surgical history.  Family History  Problem Relation Age of Onset   COPD Father    Heart disease Father    Heart failure Father        died at age 8   Colon cancer Neg Hx    Colon polyps Neg Hx     Social History   Socioeconomic History   Marital status: Single    Spouse name: Not on file   Number of children: Not on file   Years of education: Not on file   Highest education level: Not on file  Occupational History   Not on file  Tobacco Use   Smoking status: Never   Smokeless tobacco: Never  Vaping Use   Vaping Use: Never used  Substance and Sexual Activity   Alcohol use: Not Currently    Comment: drinks approx 3 times/month; beer, mixed drinks   Drug use: No   Sexual activity: Yes  Other Topics Concern   Not on file  Social History Narrative   Not on file   Social Determinants of Health   Financial Resource Strain: Not on file  Food Insecurity: Not on file  Transportation Needs: Not on file  Physical Activity: Not on file  Stress: Not on file  Social Connections: Not on file  Intimate Partner Violence: Not on file      ROS:  General: Negative for anorexia, weight loss, fever, chills, fatigue, weakness. Eyes: Negative for vision changes.  ENT: Negative for hoarseness, difficulty swallowing , nasal congestion. CV: Negative for chest pain, angina, palpitations, dyspnea on exertion, peripheral edema.  Respiratory: Negative for  dyspnea at rest, dyspnea on exertion, cough, sputum, wheezing.  GI: See history of present illness. GU:  Negative for dysuria, hematuria, urinary incontinence, urinary frequency, nocturnal urination.  MS: Negative for joint pain.positive low back pain.  Derm: Negative for rash or itching.  Neuro: Negative for weakness, abnormal sensation, seizure, frequent headaches, memory loss, confusion.  Psych: Negative for anxiety, depression, suicidal ideation, hallucinations.  Endo: Negative for unusual weight change.  Heme: Negative for bruising or bleeding. Allergy: Negative for rash or hives.    Physical Examination:  BP (!) 147/82   Pulse 72   Temp (!) 97.1 F (36.2 C) (Temporal)   Ht 6\' 3"  (1.905 m)   Wt 220 lb 9.6 oz (100.1 kg)   BMI 27.57 kg/m    General: Well-nourished, well-developed in no acute distress.  Head: Normocephalic, atraumatic.   Eyes: Conjunctiva pink, no icterus. Mouth: masked Neck: Supple without thyromegaly, masses, or lymphadenopathy.  Lungs: Clear to auscultation bilaterally.  Heart: Regular rate and rhythm, no murmurs rubs or gallops.  Abdomen: Bowel sounds are normal, nontender, nondistended, no hepatosplenomegaly or masses, no abdominal bruits or    hernia , no rebound or guarding.   Rectal: not performed Extremities: No lower extremity edema. No clubbing or deformities.  Neuro: Alert and oriented x 4 , grossly normal neurologically.  Skin: Warm and dry, no rash or jaundice.   Psych: Alert and cooperative, normal mood and affect.  Labs: Lab Results  Component Value Date   CREATININE 0.89 12/15/2020   BUN 13 12/15/2020   NA 142 12/15/2020   K 4.7 12/15/2020   CL 107 12/15/2020   CO2 25 12/15/2020   Lab Results  Component Value Date   ALT 18 12/15/2020   AST 23 12/15/2020   ALKPHOS 45 10/14/2016   BILITOT 0.5 12/15/2020   Lab Results  Component Value Date   WBC 3.8 12/15/2020   HGB 14.1 12/15/2020   HCT 43.6 12/15/2020   MCV 80.3 12/15/2020    PLT 274 12/15/2020     Imaging Studies: No results found.   Assessment:  Pleasant 50 year old male presenting to schedule screening colonoscopy.  No family history of colon cancer or colon polyps.  Remote colonoscopy with no significant findings per patient.  He has lower lumbar back issues, recently some pain on the right lower back.  That seems to improved with good BM.  He is not sure if this is related to incomplete BMs/constipation.  May or may not be related.     Plan: Can try MiraLAX twice daily until good complete bowel movement, then back down to once daily as needed.  Colonoscopy in the near future with Dr. Abbey Chatters.  ASA 2.  I have discussed the risks, alternatives, benefits with regards to but not limited to the risk of reaction to medication, bleeding, infection, perforation and the patient is agreeable to proceed. Written consent to be obtained. Based on colonoscopy findings, can determine if any further work-up needed for back issue as outlined above.

## 2021-09-28 ENCOUNTER — Telehealth: Payer: Self-pay

## 2021-09-28 NOTE — Telephone Encounter (Signed)
Tried to call pt to schedule TCS w/Propofol ASA 2 w/Dr. Abbey Chatters, LMOVM for return call.

## 2021-09-29 ENCOUNTER — Other Ambulatory Visit: Payer: Self-pay

## 2021-09-29 MED ORDER — PEG 3350-KCL-NA BICARB-NACL 420 G PO SOLR: 4000.0000 mL | ORAL | 0 refills | Status: DC

## 2021-09-29 NOTE — Telephone Encounter (Signed)
Pt called office, TCS scheduled for 11/05/21 at 1:30pm. Rx for prep sent to pharmacy. Orders entered. Instructions mailed.  No PA needed for TCS per AIM website.

## 2021-09-29 NOTE — Telephone Encounter (Signed)
Tried to call pt, LMOVM for return call. 

## 2021-11-04 ENCOUNTER — Telehealth: Payer: Self-pay | Admitting: *Deleted

## 2021-11-04 NOTE — Telephone Encounter (Signed)
Patient returned call. He will check with his transportation to see if he can move procedure up and will call back

## 2021-11-04 NOTE — Telephone Encounter (Signed)
Patient returned call. He has moved procedure time to 11am. Aware of new prep times and arrival times.

## 2021-11-04 NOTE — Telephone Encounter (Signed)
Called pt, LMOVM to call back to see if pt can moved procedure time up for tomorrow

## 2021-11-05 ENCOUNTER — Ambulatory Visit (HOSPITAL_COMMUNITY)
Admission: RE | Admit: 2021-11-05 | Discharge: 2021-11-05 | Disposition: A | Discharge status: Home / Self Care | Payer: BC Managed Care – PPO | Attending: Internal Medicine | Admitting: Internal Medicine

## 2021-11-05 ENCOUNTER — Ambulatory Visit (HOSPITAL_COMMUNITY): Payer: BC Managed Care – PPO | Admitting: Anesthesiology

## 2021-11-05 ENCOUNTER — Encounter (HOSPITAL_COMMUNITY): Payer: Self-pay

## 2021-11-05 ENCOUNTER — Encounter (HOSPITAL_COMMUNITY)
Admission: RE | Disposition: A | Discharge status: Home / Self Care | Payer: Self-pay | Source: Home / Self Care | Attending: Internal Medicine

## 2021-11-05 ENCOUNTER — Other Ambulatory Visit: Payer: Self-pay

## 2021-11-05 DIAGNOSIS — Z1211 Encounter for screening for malignant neoplasm of colon: Secondary | ICD-10-CM | POA: Insufficient documentation

## 2021-11-05 DIAGNOSIS — D122 Benign neoplasm of ascending colon: Secondary | ICD-10-CM | POA: Diagnosis not present

## 2021-11-05 DIAGNOSIS — D125 Benign neoplasm of sigmoid colon: Secondary | ICD-10-CM | POA: Insufficient documentation

## 2021-11-05 DIAGNOSIS — I10 Essential (primary) hypertension: Secondary | ICD-10-CM | POA: Diagnosis not present

## 2021-11-05 HISTORY — PX: COLONOSCOPY WITH PROPOFOL: SHX5780

## 2021-11-05 HISTORY — PX: POLYPECTOMY: SHX5525

## 2021-11-05 SURGERY — COLONOSCOPY WITH PROPOFOL
Anesthesia: General

## 2021-11-05 MED ORDER — LACTATED RINGERS IV SOLN: INTRAVENOUS | Status: DC

## 2021-11-05 MED ORDER — LACTATED RINGERS IV SOLN: INTRAVENOUS | Status: DC | PRN

## 2021-11-05 MED ORDER — PROPOFOL 10 MG/ML IV BOLUS
INTRAVENOUS | Status: DC | PRN
  Administered 2021-11-05 (×7): 50 mg via INTRAVENOUS

## 2021-11-05 MED ORDER — PROPOFOL 500 MG/50ML IV EMUL
INTRAVENOUS | Status: AC
  Filled 2021-11-05: qty 50

## 2021-11-05 NOTE — Discharge Instructions (Addendum)
°  Colonoscopy Discharge Instructions  Read the instructions outlined below and refer to this sheet in the next few weeks. These discharge instructions provide you with general information on caring for yourself after you leave the hospital. Your doctor may also give you specific instructions. While your treatment has been planned according to the most current medical practices available, unavoidable complications occasionally occur.   ACTIVITY You may resume your regular activity, but move at a slower pace for the next 24 hours.  Take frequent rest periods for the next 24 hours.  Walking will help get rid of the air and reduce the bloated feeling in your belly (abdomen).  No driving for 24 hours (because of the medicine (anesthesia) used during the test).   Do not sign any important legal documents or operate any machinery for 24 hours (because of the anesthesia used during the test).  NUTRITION Drink plenty of fluids.  You may resume your normal diet as instructed by your doctor.  Begin with a light meal and progress to your normal diet. Heavy or fried foods are harder to digest and may make you feel sick to your stomach (nauseated).  Avoid alcoholic beverages for 24 hours or as instructed.  MEDICATIONS You may resume your normal medications unless your doctor tells you otherwise.  WHAT YOU CAN EXPECT TODAY Some feelings of bloating in the abdomen.  Passage of more gas than usual.  Spotting of blood in your stool or on the toilet paper.  IF YOU HAD POLYPS REMOVED DURING THE COLONOSCOPY: No aspirin products for 7 days or as instructed.  No alcohol for 7 days or as instructed.  Eat a soft diet for the next 24 hours.  FINDING OUT THE RESULTS OF YOUR TEST Not all test results are available during your visit. If your test results are not back during the visit, make an appointment with your caregiver to find out the results. Do not assume everything is normal if you have not heard from your  caregiver or the medical facility. It is important for you to follow up on all of your test results.  SEEK IMMEDIATE MEDICAL ATTENTION IF: You have more than a spotting of blood in your stool.  Your belly is swollen (abdominal distention).  You are nauseated or vomiting.  You have a temperature over 101.  You have abdominal pain or discomfort that is severe or gets worse throughout the day.   Your colonoscopy revealed 2 polyp(s) which I removed successfully. Await pathology results, my office will contact you. I recommend repeating colonoscopy in 5-10 years for surveillance purposes depending on pathology results. Otherwise follow up with GI as needed.    I hope you have a great rest of your week!  Charles K. Carver, D.O. Gastroenterology and Hepatology [facility]  

## 2021-11-05 NOTE — Transfer of Care (Signed)
Immediate Anesthesia Transfer of Care Note  Patient: Aaron Page  Procedure(s) Performed: COLONOSCOPY WITH PROPOFOL POLYPECTOMY  Patient Location: PACU  Anesthesia Type:MAC  Level of Consciousness: awake, alert  and oriented  Airway & Oxygen Therapy: Patient Spontanous Breathing and Patient connected to nasal cannula oxygen  Post-op Assessment: Report given to RN and Post -op Vital signs reviewed and stable  Post vital signs: Reviewed and stable  Last Vitals:  Vitals Value Taken Time  BP    Temp    Pulse    Resp    SpO2      Last Pain:  Vitals:   11/05/21 1041  TempSrc:   PainSc: 0-No pain      Patients Stated Pain Goal: 10 (50/51/83 3582)  Complications: No notable events documented.

## 2021-11-05 NOTE — H&P (Signed)
Primary Care Physician:  Eulogio Bear, NP Primary Gastroenterologist:  Dr. Abbey Chatters  Pre-Procedure History & Physical: HPI:  Aaron Page is a 50 y.o. male is here for a colonoscopy for colon cancer screening purposes.  Patient denies any family history of colorectal cancer.  No melena or hematochezia.  No abdominal pain or unintentional weight loss.  No change in bowel habits.  Overall feels well from a GI standpoint.  Past Medical History:  Diagnosis Date   GAD (generalized anxiety disorder) 02/15/2016   Hypertension    Inguinal hernia 03/25/2015   Sickle cell trait ([place])     Past Surgical History:  Procedure Laterality Date   NO PAST SURGERIES      Prior to Admission medications   Medication Sig Start Date End Date Taking? Authorizing Provider  cholecalciferol (VITAMIN D3) 25 MCG (1000 UNIT) tablet Take 1 tablet (1,000 Units total) by mouth daily. 12/11/19  Yes Blackshear, Modena Nunnery, MD  gabapentin (NEURONTIN) 300 MG capsule Take 300 mg by mouth every 8 (eight) hours as needed (pain). 06/26/19  Yes [provider]  ibuprofen (ADVIL) 800 MG tablet Take 800 mg by mouth every 8 (eight) hours as needed for moderate pain.   Yes [provider]  methocarbamol (ROBAXIN) 500 MG tablet Take 500 mg by mouth 2 (two) times daily as needed for muscle spasms. 05/07/21  Yes [provider]  Multiple Vitamin (MULTIVITAMIN WITH MINERALS) TABS tablet Take 1 tablet by mouth daily.   Yes [provider]  Omega 3 1000 MG CAPS Take 1 capsule (1,000 mg total) by mouth 2 (two) times daily. Patient taking differently: Take 1,000 mg by mouth daily. 12/14/20  Yes Noemi Chapel A, NP  triamcinolone cream (KENALOG) 0.1 % APPLY TO AFFECTED AREA TWICE A DAY Patient taking differently: 1 application 2 (two) times daily as needed (rash). APPLY TO AFFECTED AREA TWICE A DAY 05/20/21  Yes Noemi Chapel A, NP  polyethylene glycol-electrolytes (TRILYTE) 420 g solution Take 4,000  mLs by mouth as directed. 09/29/21   Eloise Harman, DO    Allergies as of 09/29/2021 - Review Complete 09/27/2021  Allergen Reaction Noted   Penicillins Nausea And Vomiting and Rash 03/19/2015    Family History  Problem Relation Age of Onset   COPD Father    Heart disease Father    Heart failure Father        died at age 57   Colon cancer Neg Hx    Colon polyps Neg Hx     Social History   Socioeconomic History   Marital status: Single    Spouse name: Not on file   Number of children: Not on file   Years of education: Not on file   Highest education level: Not on file  Occupational History   Not on file  Tobacco Use   Smoking status: Never   Smokeless tobacco: Never  Vaping Use   Vaping Use: Never used  Substance and Sexual Activity   Alcohol use: Yes    Comment: drinks approx 3 times/month; beer, mixed drinks   Drug use: No   Sexual activity: Yes  Other Topics Concern   Not on file  Social History Narrative   Not on file   Social Determinants of Health   Financial Resource Strain: Not on file  Food Insecurity: Not on file  Transportation Needs: Not on file  Physical Activity: Not on file  Stress: Not on file  Social Connections: Not on file  Intimate  Partner Violence: Not on file    Review of Systems: See HPI, otherwise negative ROS  Physical Exam: Vital signs in last 24 hours: Temp:  [98.6 F (37 C)] 98.6 F (37 C) (12/30 0952) Resp:  [23] 23 (12/30 0952) BP: (166)/(91) 166/91 (12/30 0952) SpO2:  [97 %] 97 % (12/30 0952) Weight:  [99.8 kg] 99.8 kg (12/30 0952)   General:   Alert,  Well-developed, well-nourished, pleasant and cooperative in NAD Head:  Normocephalic and atraumatic. Eyes:  Sclera clear, no icterus.   Conjunctiva pink. Ears:  Normal auditory acuity. Nose:  No deformity, discharge,  or lesions. Mouth:  No deformity or lesions, dentition normal. Neck:  Supple; no masses or thyromegaly. Lungs:  Clear throughout to auscultation.    No wheezes, crackles, or rhonchi. No acute distress. Heart:  Regular rate and rhythm; no murmurs, clicks, rubs,  or gallops. Abdomen:  Soft, nontender and nondistended. No masses, hepatosplenomegaly or hernias noted. Normal bowel sounds, without guarding, and without rebound.   Msk:  Symmetrical without gross deformities. Normal posture. Extremities:  Without clubbing or edema. Neurologic:  Alert and  oriented x4;  grossly normal neurologically. Skin:  Intact without significant lesions or rashes. Cervical Nodes:  No significant cervical adenopathy. Psych:  Alert and cooperative. Normal mood and affect.  Impression/Plan: Aaron Page is here for a colonoscopy to be performed for colon cancer screening purposes.  The risks of the procedure including infection, bleed, or perforation as well as benefits, limitations, alternatives and imponderables have been reviewed with the patient. Questions have been answered. All parties agreeable.

## 2021-11-05 NOTE — Anesthesia Preprocedure Evaluation (Signed)
Anesthesia Evaluation  Patient identified by MRN, date of birth, ID band Patient awake    Reviewed: Allergy & Precautions, H&P , NPO status , Patient's Chart, lab work & pertinent test results, reviewed documented beta blocker date and time   Airway Mallampati: II  TM Distance: >3 FB Neck ROM: full    Dental no notable dental hx.    Pulmonary neg pulmonary ROS,    Pulmonary exam normal breath sounds clear to auscultation       Cardiovascular Exercise Tolerance: Good hypertension, negative cardio ROS   Rhythm:regular Rate:Normal     Neuro/Psych PSYCHIATRIC DISORDERS Anxiety negative neurological ROS     GI/Hepatic negative GI ROS, Neg liver ROS,   Endo/Other  negative endocrine ROS  Renal/GU negative Renal ROS  negative genitourinary   Musculoskeletal   Abdominal   Peds  Hematology  (+) Blood dyscrasia, Sickle cell trait ,   Anesthesia Other Findings   Reproductive/Obstetrics negative OB ROS                             Anesthesia Physical Anesthesia Plan  ASA: 2  Anesthesia Plan: General   Post-op Pain Management:    Induction:   PONV Risk Score and Plan: Propofol infusion  Airway Management Planned:   Additional Equipment:   Intra-op Plan:   Post-operative Plan:   Informed Consent: I have reviewed the patients History and Physical, chart, labs and discussed the procedure including the risks, benefits and alternatives for the proposed anesthesia with the patient or authorized representative who has indicated his/her understanding and acceptance.     Dental Advisory Given  Plan Discussed with: CRNA  Anesthesia Plan Comments:         Anesthesia Quick Evaluation

## 2021-11-05 NOTE — Op Note (Signed)
[facility] Patient Name: Aaron Page Procedure Date: 11/05/2021 10:35 AM MRN: [id number] Date of Birth: 1970-11-10 Attending MD: Elon Alas. Abbey Chatters DO CSN: [id number] Age: 50 Admit Type: Outpatient Procedure:                Colonoscopy Indications:              Screening for colorectal malignant neoplasm Providers:                Elon Alas. Abbey Chatters, DO, Hughie Closs, RN, Rosina Lowenstein, RN, Raphael Gibney, Technician Referring MD:              Medicines:                See the Anesthesia note for documentation of the                            administered medications Complications:            No immediate complications. Estimated Blood Loss:     Estimated blood loss was minimal. Procedure:                Pre-Anesthesia Assessment:                           - The anesthesia plan was to use monitored                            anesthesia care (MAC).                           After obtaining informed consent, the colonoscope                            was passed under direct vision. Throughout the                            procedure, the patient's blood pressure, pulse, and                            oxygen saturations were monitored continuously. The                            [id number]-HQ190L ([id number]) scope was introduced through                            the anus and advanced to the the cecum, identified                            by appendiceal orifice and ileocecal valve. The                            colonoscopy was performed without difficulty. The                            patient tolerated the  procedure well. The quality                            of the bowel preparation was evaluated using the                            BBPS [facility] Bowel Preparation Scale) with scores                            of: Right Colon = 3, Transverse Colon = 3 and Left                            Colon = 3 (entire mucosa seen well with no residual                             staining, small fragments of stool or opaque                            liquid). The total BBPS score equals 9. Scope In: 10:47:07 AM Scope Out: 11:02:42 AM Scope Withdrawal Time: 0 hours 12 minutes 57 seconds  Total Procedure Duration: 0 hours 15 minutes 35 seconds  Findings:      The perianal and digital rectal examinations were normal.      A 3 mm polyp was found in the ascending colon. The polyp was sessile.       The polyp was removed with a cold snare. Resection and retrieval were       complete.      A 5 mm polyp was found in the sigmoid colon. The polyp was sessile. The       polyp was removed with a cold snare. Resection and retrieval were       complete.      The exam was otherwise without abnormality. Impression:               - One 3 mm polyp in the ascending colon, removed                            with a cold snare. Resected and retrieved.                           - One 5 mm polyp in the sigmoid colon, removed with                            a cold snare. Resected and retrieved.                           - The examination was otherwise normal. Moderate Sedation:      Per Anesthesia Care Recommendation:           - Patient has a contact number available for                            emergencies. The signs and symptoms of potential  delayed complications were discussed with the                            patient. Return to normal activities tomorrow.                            Written discharge instructions were provided to the                            patient.                           - Resume previous diet.                           - Continue present medications.                           - Await pathology results.                           - Repeat colonoscopy in 5-10 years for surveillance.                           - Return to GI clinic PRN. Procedure Code(s):        --- Professional ---                           [phone],  Colonoscopy, flexible; with removal of                            tumor(s), polyp(s), or other lesion(s) by snare                            technique Diagnosis Code(s):        --- Professional ---                           Z12.11, Encounter for screening for malignant                            neoplasm of colon                           K63.5, Polyp of colon                           K64.8, Other hemorrhoids CPT copyright 2019 [employer]. All rights reserved. The codes documented in this report are preliminary and upon coder review may  be revised to meet current compliance requirements. Elon Alas. Abbey Chatters, DO Waldron Merrill, DO 11/05/2021 11:05:40 AM This report has been signed electronically. Number of Addenda: 0

## 2021-11-05 NOTE — Anesthesia Postprocedure Evaluation (Signed)
Anesthesia Post Note  Patient: Aaron Page  Procedure(s) Performed: COLONOSCOPY WITH PROPOFOL POLYPECTOMY  Patient location during evaluation: Phase II Anesthesia Type: General Level of consciousness: awake Pain management: pain level controlled Vital Signs Assessment: post-procedure vital signs reviewed and stable Respiratory status: spontaneous breathing and respiratory function stable Cardiovascular status: blood pressure returned to baseline and stable Postop Assessment: no headache and no apparent nausea or vomiting Anesthetic complications: no Comments: Late entry   No notable events documented.   Last Vitals:  Vitals:   11/05/21 1103 11/05/21 1108  BP: 110/63   Pulse: 67 65  Resp: 20   Temp: (!) 36.4 C   SpO2: 97% 98%    Last Pain:  Vitals:   11/05/21 1108  TempSrc:   PainSc: 0-No pain                 Louann Sjogren

## 2021-11-09 LAB — SURGICAL PATHOLOGY

## 2021-11-11 ENCOUNTER — Encounter (HOSPITAL_COMMUNITY): Payer: Self-pay | Admitting: Internal Medicine

## 2022-02-03 ENCOUNTER — Other Ambulatory Visit: Payer: Self-pay | Admitting: Nurse Practitioner

## 2022-03-10 ENCOUNTER — Other Ambulatory Visit: Payer: Self-pay | Admitting: Nurse Practitioner

## 2022-03-10 NOTE — Telephone Encounter (Signed)
Provider no longer at [facility] ? ?Requested Prescriptions  ?Refused Prescriptions Disp Refills  ?? ibuprofen (ADVIL) 800 MG tablet [Pharmacy Med Name: IBUPROFEN 800 MG TABLET] 30 tablet 1  ?  Sig: TAKE 1 TABLET (800 MG TOTAL) BY MOUTH ONCE FOR 1 DOSE.  ?  ? Analgesics:  NSAIDS Failed - 03/10/2022 10:38 AM  ?  ?  Failed - Manual Review: Labs are only required if the patient has taken medication for more than 8 weeks.  ?  ?  Failed - Cr in normal range and within 360 days  ?  Creat  ?Date Value Ref Range Status  ?12/15/2020 0.89 0.70 - 1.33 mg/dL Final  ?  Comment:  ?  For patients >73 years of age, the reference limit ?for Creatinine is approximately 13% higher for people ?identified as African-American. ?. ?  ?   ?  ?  Failed - HGB in normal range and within 360 days  ?  Hemoglobin  ?Date Value Ref Range Status  ?12/15/2020 14.1 13.2 - 17.1 g/dL Final  ?   ?  ?  Failed - PLT in normal range and within 360 days  ?  Platelets  ?Date Value Ref Range Status  ?12/15/2020 274 140 - 400 Thousand/uL Final  ?   ?  ?  Failed - HCT in normal range and within 360 days  ?  HCT  ?Date Value Ref Range Status  ?12/15/2020 43.6 38.5 - 50.0 % Final  ?   ?  ?  Failed - eGFR is 30 or above and within 360 days  ?  GFR, Est African American  ?Date Value Ref Range Status  ?12/15/2020 116 > OR = 60 mL/min/1.52m2 Final  ? ?GFR, Est Non African American  ?Date Value Ref Range Status  ?12/15/2020 100 > OR = 60 mL/min/1.69m2 Final  ?   ?  ?  Failed - Valid encounter within last 12 months  ?  Recent Outpatient Visits   ?      ? 1 year ago Annual physical exam  ? [place] Noemi Chapel A, NP  ? 2 years ago COVID-19 virus infection  ? [place], Modena Nunnery, MD  ? 2 years ago Routine general medical examination at a health care facility  ? [facility] [place], Modena Nunnery, MD  ? 3 years ago OAB (overactive bladder)  ? [facility] [place], Cammie Mcgee, MD   ? 4 years ago Routine general medical examination at a health care facility  ? [place], Modena Nunnery, MD  ?  ?  ? ?  ?  ?  Passed - Patient is not pregnant  ?  ?  ? ?

## 2022-05-07 DIAGNOSIS — H60331 Swimmer's ear, right ear: Secondary | ICD-10-CM | POA: Insufficient documentation

## 2022-05-11 ENCOUNTER — Encounter: Payer: Self-pay | Admitting: Family Medicine

## 2022-05-11 ENCOUNTER — Ambulatory Visit: Payer: BC Managed Care – PPO | Admitting: Family Medicine

## 2022-05-11 VITALS — BP 138/86 | HR 69 | Temp 97.8°F | Ht 75.0 in | Wt 222.0 lb

## 2022-05-11 DIAGNOSIS — H66001 Acute suppurative otitis media without spontaneous rupture of ear drum, right ear: Secondary | ICD-10-CM | POA: Diagnosis not present

## 2022-05-11 DIAGNOSIS — Z7689 Persons encountering health services in other specified circumstances: Secondary | ICD-10-CM | POA: Diagnosis not present

## 2022-05-11 NOTE — Progress Notes (Signed)
New Patient Office Visit  Subjective    Patient ID: Aaron Page, male    DOB: 09/12/1971  Age: 51 y.o. MRN: [id number]  CC:  Chief Complaint  Patient presents with   Establish Care    No concerns.     HPI Aaron Page presents to establish care  Diagnosed 3-4 days ago with swimmers ear. Taking oral antibiotics and using ofloxacin. Pain improving but still feels like his right ear is clogged. No URI symptoms. No fever, chills, headache, dizziness.   Lives in [place].   Gabapentin for back pain.  Dr. Carlos Levering at [facility] in [place], orthopedist.       Outpatient Encounter Medications as of 05/11/2022  Medication Sig   gabapentin (NEURONTIN) 300 MG capsule Take 300 mg by mouth every 8 (eight) hours as needed (pain).   ibuprofen (ADVIL) 800 MG tablet Take 800 mg by mouth every 8 (eight) hours as needed for moderate pain.   Multiple Vitamin (MULTIVITAMIN WITH MINERALS) TABS tablet Take 1 tablet by mouth daily.   ofloxacin (FLOXIN) 0.3 % OTIC solution SMARTSIG:10 Drop(s) In Ear(s) Daily   Omega 3 1000 MG CAPS Take 1 capsule (1,000 mg total) by mouth 2 (two) times daily. (Patient taking differently: Take 1,000 mg by mouth daily.)   sulfamethoxazole-trimethoprim (BACTRIM DS) 800-160 MG tablet SMARTSIG:1 Tablet(s) By Mouth Every 12 Hours   triamcinolone cream (KENALOG) 0.1 % APPLY TO AFFECTED AREA TWICE A DAY (Patient taking differently: 1 application  2 (two) times daily as needed (rash). APPLY TO AFFECTED AREA TWICE A DAY)   [DISCONTINUED] cholecalciferol (VITAMIN D3) 25 MCG (1000 UNIT) tablet Take 1 tablet (1,000 Units total) by mouth daily.   [DISCONTINUED] methocarbamol (ROBAXIN) 500 MG tablet Take 500 mg by mouth 2 (two) times daily as needed for muscle spasms.   No facility-administered encounter medications on file as of 05/11/2022.    Past Medical History:  Diagnosis Date   GAD (generalized anxiety disorder) 02/15/2016   Hypertension    Inguinal  hernia 03/25/2015   Sickle cell trait [facility])     Past Surgical History:  Procedure Laterality Date   COLONOSCOPY WITH PROPOFOL N/A 11/05/2021   Procedure: COLONOSCOPY WITH PROPOFOL;  Surgeon: Eloise Harman, DO;  Location: [facility];  Service: Endoscopy;  Laterality: N/A;  1:30pm   NO PAST SURGERIES     POLYPECTOMY  11/05/2021   Procedure: POLYPECTOMY;  Surgeon: Eloise Harman, DO;  Location: [facility];  Service: Endoscopy;;    Family History  Problem Relation Age of Onset   Arthritis Mother    COPD Father    Heart disease Father    Heart failure Father        died at age 48   Colon cancer Neg Hx    Colon polyps Neg Hx     Social History   Socioeconomic History   Marital status: Single    Spouse name: Not on file   Number of children: Not on file   Years of education: Not on file   Highest education level: Not on file  Occupational History   Not on file  Tobacco Use   Smoking status: Never   Smokeless tobacco: Never  Vaping Use   Vaping Use: Never used  Substance and Sexual Activity   Alcohol use: Yes    Comment: drinks approx 3 times/month; beer, mixed drinks   Drug use: No   Sexual activity: Yes  Other Topics Concern   Not on file  Social History Narrative   Not on file   Social Determinants of Health   Financial Resource Strain: Low Risk  (12/11/2019)   Overall Financial Resource Strain (CARDIA)    Difficulty of Paying Living Expenses: Not hard at all  Food Insecurity: No Food Insecurity (12/11/2019)   Hunger Vital Sign    Worried About Running Out of Food in the Last Year: Never true    [place] in the Last Year: Never true  Transportation Needs: No Transportation Needs (12/11/2019)   PRAPARE - Hydrologist (Medical): No    Lack of Transportation (Non-Medical): No  Physical Activity: Sufficiently Active (12/11/2019)   Exercise Vital Sign    Days of Exercise per Week: 5 days    Minutes of Exercise per Session:  30 min  Stress: No Stress Concern Present (12/11/2019)   [place]    Feeling of Stress : Not at all  Social Connections: Socially Integrated (12/11/2019)   Social Connection and Isolation Panel [NHANES]    Frequency of Communication with Friends and Family: Three times a week    Frequency of Social Gatherings with Friends and Family: Once a week    Attends Religious Services: 1 to 4 times per year    Active Member of [employer] or Organizations: Yes    Attends Music therapist: More than 4 times per year    Marital Status: Married  Human resources officer Violence: Not At Risk (12/11/2019)   Humiliation, Afraid, Rape, and Kick questionnaire    Fear of Current or Ex-Partner: No    Emotionally Abused: No    Physically Abused: No    Sexually Abused: No    ROS      Objective    BP 138/86 (BP Location: Left Arm, Patient Position: Sitting, Cuff Size: Large)   Pulse 69   Temp 97.8 F (36.6 C) (Temporal)   Ht '6\' 3"'$  (1.905 m)   Wt 222 lb (100.7 kg)   SpO2 98%   BMI 27.75 kg/m   Physical Exam Constitutional:      General: He is not in acute distress.    Appearance: He is not ill-appearing.  HENT:     Right Ear: Ear canal and external ear normal. Decreased hearing noted. A middle ear effusion is present. No mastoid tenderness.     Left Ear: Hearing, tympanic membrane and ear canal normal.     Nose:     Right Sinus: No maxillary sinus tenderness or frontal sinus tenderness.     Left Sinus: No maxillary sinus tenderness or frontal sinus tenderness.     Mouth/Throat:     Mouth: Mucous membranes are moist.  Eyes:     Extraocular Movements: Extraocular movements intact.     Conjunctiva/sclera: Conjunctivae normal.     Pupils: Pupils are equal, round, and reactive to light.  Cardiovascular:     Rate and Rhythm: Normal rate.  Pulmonary:     Effort: Pulmonary effort is normal.  Musculoskeletal:     Cervical back:  Normal range of motion and neck supple.  Lymphadenopathy:     Cervical: No cervical adenopathy.  Neurological:     Mental Status: He is alert.         Assessment & Plan:   Problem List Items Addressed This Visit   None Visit Diagnoses     Acute suppurative otitis media of right ear without spontaneous rupture of tympanic membrane, recurrence  not specified    -  Primary   Relevant Medications   sulfamethoxazole-trimethoprim (BACTRIM DS) 800-160 MG tablet   Encounter to establish care          Here to establish care. Continue treatment for right ear pain and infection. Symptoms improving. Follow up when treatment is completed to ensure resolution.   Return if symptoms worsen or fail to improve.   Harland Dingwall, NP-C

## 2022-05-11 NOTE — Patient Instructions (Signed)
Continue the treatment plan you are currently on and follow up when completed or sooner if needed.

## 2022-06-22 ENCOUNTER — Ambulatory Visit (INDEPENDENT_AMBULATORY_CARE_PROVIDER_SITE_OTHER): Payer: BC Managed Care – PPO | Admitting: Family Medicine

## 2022-06-22 ENCOUNTER — Ambulatory Visit: Payer: BC Managed Care – PPO | Admitting: Family Medicine

## 2022-06-22 ENCOUNTER — Encounter: Payer: Self-pay | Admitting: Family Medicine

## 2022-06-22 VITALS — BP 136/88 | HR 65 | Temp 97.6°F | Ht 75.0 in | Wt 222.0 lb

## 2022-06-22 DIAGNOSIS — I10 Essential (primary) hypertension: Secondary | ICD-10-CM

## 2022-06-22 DIAGNOSIS — M545 Low back pain, unspecified: Secondary | ICD-10-CM

## 2022-06-22 DIAGNOSIS — Z0000 Encounter for general adult medical examination without abnormal findings: Secondary | ICD-10-CM | POA: Insufficient documentation

## 2022-06-22 DIAGNOSIS — E559 Vitamin D deficiency, unspecified: Secondary | ICD-10-CM | POA: Diagnosis not present

## 2022-06-22 DIAGNOSIS — Z113 Encounter for screening for infections with a predominantly sexual mode of transmission: Secondary | ICD-10-CM | POA: Insufficient documentation

## 2022-06-22 DIAGNOSIS — Z125 Encounter for screening for malignant neoplasm of prostate: Secondary | ICD-10-CM | POA: Insufficient documentation

## 2022-06-22 DIAGNOSIS — K219 Gastro-esophageal reflux disease without esophagitis: Secondary | ICD-10-CM | POA: Insufficient documentation

## 2022-06-22 DIAGNOSIS — G8929 Other chronic pain: Secondary | ICD-10-CM

## 2022-06-22 DIAGNOSIS — E7800 Pure hypercholesterolemia, unspecified: Secondary | ICD-10-CM | POA: Diagnosis not present

## 2022-06-22 DIAGNOSIS — R5383 Other fatigue: Secondary | ICD-10-CM | POA: Diagnosis not present

## 2022-06-22 LAB — COMPREHENSIVE METABOLIC PANEL
ALT: 21 U/L (ref 0–53)
AST: 27 U/L (ref 0–37)
Albumin: 4.5 g/dL (ref 3.5–5.2)
Alkaline Phosphatase: 54 U/L (ref 39–117)
BUN: 16 mg/dL (ref 6–23)
CO2: 27 mEq/L (ref 19–32)
Calcium: 9.7 mg/dL (ref 8.4–10.5)
Chloride: 105 mEq/L (ref 96–112)
Creatinine, Ser: 1.01 mg/dL (ref 0.40–1.50)
GFR: 86.03 mL/min (ref >60.00)
Glucose, Bld: 95 mg/dL (ref 70–99)
Potassium: 4.3 mEq/L (ref 3.5–5.1)
Sodium: 139 mEq/L (ref 135–145)
Total Bilirubin: 0.5 mg/dL (ref 0.2–1.2)
Total Protein: 7.3 g/dL (ref 6.0–8.3)

## 2022-06-22 LAB — CBC WITH DIFFERENTIAL/PLATELET
Basophils Absolute: 0 10*3/uL (ref 0.0–0.1)
Basophils Relative: 0.6 % (ref 0.0–3.0)
Eosinophils Absolute: 0 10*3/uL (ref 0.0–0.7)
Eosinophils Relative: 0.5 % (ref 0.0–5.0)
HCT: 46.9 % (ref 39.0–52.0)
Hemoglobin: 15 g/dL (ref 13.0–17.0)
Lymphocytes Relative: 29.3 % (ref 12.0–46.0)
Lymphs Abs: 1.2 10*3/uL (ref 0.7–4.0)
MCHC: 32.1 g/dL (ref 30.0–36.0)
MCV: 81.3 fl (ref 78.0–100.0)
Monocytes Absolute: 0.2 10*3/uL (ref 0.1–1.0)
Monocytes Relative: 5.7 % (ref 3.0–12.0)
Neutro Abs: 2.7 10*3/uL (ref 1.4–7.7)
Neutrophils Relative %: 63.9 % (ref 43.0–77.0)
Platelets: 234 10*3/uL (ref 150.0–400.0)
RBC: 5.76 Mil/uL (ref 4.22–5.81)
RDW: 14.5 % (ref 11.5–15.5)
WBC: 4.3 10*3/uL (ref 4.0–10.5)

## 2022-06-22 LAB — LIPID PANEL
Cholesterol: 199 mg/dL (ref 0–200)
HDL: 60.7 mg/dL (ref >39.00)
LDL Cholesterol: 126 mg/dL — ABNORMAL HIGH (ref 0–99)
NonHDL: 138.28
Total CHOL/HDL Ratio: 3
Triglycerides: 63 mg/dL (ref 0.0–149.0)
VLDL: 12.6 mg/dL (ref 0.0–40.0)

## 2022-06-22 LAB — TSH: TSH: 1.17 u[IU]/mL (ref 0.35–5.50)

## 2022-06-22 LAB — PSA: PSA: 1.95 ng/mL (ref 0.10–4.00)

## 2022-06-22 LAB — VITAMIN B12: Vitamin B-12: 372 pg/mL (ref 211–911)

## 2022-06-22 LAB — T4, FREE: Free T4: 0.69 ng/dL (ref 0.60–1.60)

## 2022-06-22 LAB — VITAMIN D 25 HYDROXY (VIT D DEFICIENCY, FRACTURES): VITD: 21.35 ng/mL — ABNORMAL LOW (ref 30.00–100.00)

## 2022-06-22 NOTE — Progress Notes (Signed)
Subjective:    Patient ID: Aaron Page, male    DOB: August 06, 1971, 51 y.o.   MRN: [id number]  HPI Chief Complaint  Patient presents with   Annual Exam   He is fairly new to the practice and here for a complete physical exam.  Other providers: [place] in [place]- Dr. Megan Salon   Hx of herniated disc in L4-L5. Pain in right low back. Hx of injections. Takes Tylenol, ibuprofen 800 mg and gabapentin as needed.   States he has a sleep study is scheduled next month.  Fatigue has been a concern   Vitamin D def- is not currently taking a supplement   Elevated LDL in past   STD testing- unprotected sex in the past.   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 Social drinker  Diet: fairly healthy  Exercise: nothing regular, walks   Immunizations: declines Shingrix   Health maintenance:   Colonoscopy: 10/2021 and 10 yr recall  Last PSA: 12/2020 and normal  Last Dental Exam: regular visits Last Eye Exam:  regular visit    Reviewed allergies, medications, past medical, surgical, family, and social history.    Review of Systems Review of Systems Constitutional: -fever, -chills, -sweats, -unexpected weight change,+fatigue ENT: -runny nose, -ear pain, -sore throat Cardiology:  -chest pain, -palpitations, -edema Respiratory: -cough, -shortness of breath, -wheezing Gastroenterology: -abdominal pain, -nausea, -vomiting, -diarrhea, -constipation  Hematology: -bleeding or bruising problems Musculoskeletal: -arthralgias, -myalgias, -joint swelling, +back pain Ophthalmology: -vision changes Urology: -dysuria, -difficulty urinating, -hematuria, -urinary frequency, -urgency Neurology: -headache, -weakness, -tingling, -numbness       Objective:   Physical Exam BP 136/88 (BP Location: Left Arm, Patient Position: Sitting, Cuff Size: Large)   Pulse 65   Temp 97.6 F (36.4 C)  (Temporal)   Ht '6\' 3"'$  (1.905 m)   Wt 222 lb (100.7 kg)   SpO2 97%   BMI 27.75 kg/m   General Appearance:    Alert, cooperative, no distress, appears stated age  Head:    Normocephalic, without obvious abnormality, atraumatic  Eyes:    PERRL, conjunctiva/corneas clear, EOM's intact  Ears:    Normal TM's and external ear canals  Nose:   Nares normal, mucosa normal, no drainage or sinus   tenderness  Throat:   Lips, mucosa, and tongue normal; teeth and gums normal  Neck:   Supple, no lymphadenopathy;  thyroid:  no   enlargement/tenderness/nodules; no JVD  Back:    Spine nontender, no curvature, ROM normal, no CVA     tenderness  Lungs:     Clear to auscultation bilaterally without wheezes, rales or     ronchi; respirations unlabored  Chest Wall:    No tenderness or deformity   Heart:    Regular rate and rhythm, S1 and S2 normal, no murmur, rub   or gallop  Breast Exam:    No chest wall tenderness, masses or gynecomastia  Abdomen:     Soft, non-tender, nondistended, normoactive bowel sounds,    no masses, no hepatosplenomegaly  Genitalia:    Not done      Extremities:   No clubbing, cyanosis or edema  Pulses:   2+ and symmetric all extremities  Skin:   Skin color, texture, turgor normal, no rashes or lesions  Lymph nodes:   Cervical, supraclavicular, and axillary nodes normal  Neurologic:   CNII-XII intact, normal strength, sensation and gait  Psych:   Normal mood, affect, hygiene and grooming.        Assessment & Plan:  Routine general medical examination at a health care facility - Plan: CBC with Differential/Platelet, Comprehensive metabolic panel, Comprehensive metabolic panel, CBC with Differential/Platelet -Preventive health care reviewed.  Up-to-date on colonoscopy.  PSA ordered.  Counseling on healthy lifestyle including diet and exercise.  Recommend regular dental and eye exams.  Immunizations reviewed.  Discussed safety. May return for Shingrix   Vitamin D  deficiency - Plan: VITAMIN D 25 Hydroxy (Vit-D Deficiency, Fractures), VITAMIN D 25 Hydroxy (Vit-D Deficiency, Fractures) -Is not currently taking a supplement.  Check vitamin D level and follow-up  Chronic midline low back pain without sciatica -He sees an orthopedist in [place].  Essential hypertension -Diet controlled currently.  Recommend he continue to keep an eye on his blood pressure at home and follow-up if readings are consistently out of goal range.  Fatigue, unspecified type - Plan: TSH, T4, free, Vitamin B12, Vitamin B12, T4, free, TSH -Upcoming sleep study per patient.  Check labs  Screening for prostate cancer - Plan: PSA, PSA  Screen for STD (sexually transmitted disease) - Plan: Hepatitis C antibody, HIV Antibody (routine testing w rflx), RPR, GC/Chlamydia Probe Amp, GC/Chlamydia Probe Amp, RPR, HIV Antibody (routine testing w rflx), Hepatitis C antibody -Done per patient request  Elevated LDL cholesterol level - Plan: Lipid panel, Lipid panel -Recommend low-fat, low-cholesterol diet and getting adequate physical activity.

## 2022-06-22 NOTE — Patient Instructions (Signed)
Please do to the first floor for labs before you leave today.     Preventive Care 52-52 Years Old, Male Preventive care refers to lifestyle choices and visits with your health care provider that can promote health and wellness. Preventive care visits are also called wellness exams. What can I expect for my preventive care visit? Counseling During your preventive care visit, your health care provider may ask about your: Medical history, including: Past medical problems. Family medical history. Current health, including: Emotional well-being. Home life and relationship well-being. Sexual activity. Lifestyle, including: Alcohol, nicotine or tobacco, and drug use. Access to firearms. Diet, exercise, and sleep habits. Safety issues such as seatbelt and bike helmet use. Sunscreen use. Work and work Statistician. Physical exam Your health care provider will check your: Height and weight. These may be used to calculate your BMI (body mass index). BMI is a measurement that tells if you are at a healthy weight. Waist circumference. This measures the distance around your waistline. This measurement also tells if you are at a healthy weight and may help predict your risk of certain diseases, such as type 2 diabetes and high blood pressure. Heart rate and blood pressure. Body temperature. Skin for abnormal spots. What immunizations do I need?  Vaccines are usually given at various ages, according to a schedule. Your health care provider will recommend vaccines for you based on your age, medical history, and lifestyle or other factors, such as travel or where you work. What tests do I need? Screening Your health care provider may recommend screening tests for certain conditions. This may include: Lipid and cholesterol levels. Diabetes screening. This is done by checking your blood sugar (glucose) after you have not eaten for a while (fasting). Hepatitis B test. Hepatitis C test. HIV (human  immunodeficiency virus) test. STI (sexually transmitted infection) testing, if you are at risk. Lung cancer screening. Prostate cancer screening. Colorectal cancer screening. Talk with your health care provider about your test results, treatment options, and if necessary, the need for more tests. Follow these instructions at home: Eating and drinking  Eat a diet that includes fresh fruits and vegetables, whole grains, lean protein, and low-fat dairy products. Take vitamin and mineral supplements as recommended by your health care provider. Do not drink alcohol if your health care provider tells you not to drink. If you drink alcohol: Limit how much you have to 0-2 drinks a day. Know how much alcohol is in your drink. In the [place] one drink equals one 12 oz bottle of beer (355 mL), one 5 oz glass of wine (148 mL), or one 1 oz glass of hard liquor (44 mL). Lifestyle Brush your teeth every morning and night with fluoride toothpaste. Floss one time each day. Exercise for at least 30 minutes 5 or more days each week. Do not use any products that contain nicotine or tobacco. These products include cigarettes, chewing tobacco, and vaping devices, such as e-cigarettes. If you need help quitting, ask your health care provider. Do not use drugs. If you are sexually active, practice safe sex. Use a condom or other form of protection to prevent STIs. Take aspirin only as told by your health care provider. Make sure that you understand how much to take and what form to take. Work with your health care provider to find out whether it is safe and beneficial for you to take aspirin daily. Find healthy ways to manage stress, such as: Meditation, yoga, or listening to music. Journaling. Talking to  a trusted person. Spending time with friends and family. Minimize exposure to UV radiation to reduce your risk of skin cancer. Safety Always wear your seat belt while driving or riding in a vehicle. Do not  drive: If you have been drinking alcohol. Do not ride with someone who has been drinking. When you are tired or distracted. While texting. If you have been using any mind-altering substances or drugs. Wear a helmet and other protective equipment during sports activities. If you have firearms in your house, make sure you follow all gun safety procedures. What's next? Go to your health care provider once a year for an annual wellness visit. Ask your health care provider how often you should have your eyes and teeth checked. Stay up to date on all vaccines. This information is not intended to replace advice given to you by your health care provider. Make sure you discuss any questions you have with your health care provider. Document Revised: 04/21/2021 Document Reviewed: 04/21/2021 Elsevier Patient Education  [place].

## 2022-06-23 LAB — RPR: RPR Ser Ql: NONREACTIVE

## 2022-06-23 LAB — HIV ANTIBODY (ROUTINE TESTING W REFLEX): HIV 1&2 Ab, 4th Generation: NONREACTIVE

## 2022-06-23 LAB — HEPATITIS C ANTIBODY: Hepatitis C Ab: NONREACTIVE

## 2022-06-25 LAB — GC/CHLAMYDIA PROBE AMP
Chlamydia trachomatis, NAA: NEGATIVE
Neisseria Gonorrhoeae by PCR: NEGATIVE

## 2022-06-26 ENCOUNTER — Other Ambulatory Visit: Payer: Self-pay | Admitting: Family Medicine

## 2022-06-26 DIAGNOSIS — E559 Vitamin D deficiency, unspecified: Secondary | ICD-10-CM

## 2022-06-26 MED ORDER — VITAMIN D (ERGOCALCIFEROL) 1.25 MG (50000 UNIT) PO CAPS: 50000.0000 [IU] | ORAL_CAPSULE | ORAL | 0 refills | Status: DC

## 2022-07-16 ENCOUNTER — Other Ambulatory Visit: Payer: Self-pay | Admitting: Family Medicine

## 2022-07-16 DIAGNOSIS — E559 Vitamin D deficiency, unspecified: Secondary | ICD-10-CM

## 2022-07-18 ENCOUNTER — Other Ambulatory Visit: Payer: Self-pay | Admitting: Family Medicine

## 2022-07-18 DIAGNOSIS — E559 Vitamin D deficiency, unspecified: Secondary | ICD-10-CM

## 2022-07-18 MED ORDER — ADULT MULTIVITAMIN W/MINERALS CH: 1.0000 | ORAL_TABLET | Freq: Every day | ORAL | 1 refills | Status: DC

## 2022-07-18 MED ORDER — ADULT MULTIVITAMIN W/MINERALS CH: 1.0000 | ORAL_TABLET | Freq: Every day | ORAL | 1 refills | Status: AC

## 2022-07-18 NOTE — Addendum Note (Signed)
Addended by: Rossie Muskrat on: 07/18/2022 04:28 PM   Modules accepted: Orders

## 2022-07-19 NOTE — Telephone Encounter (Signed)
Ok to refill advil?   Will deny the vitamin D.

## 2022-07-21 ENCOUNTER — Other Ambulatory Visit: Payer: Self-pay | Admitting: Family Medicine

## 2022-07-21 DIAGNOSIS — E559 Vitamin D deficiency, unspecified: Secondary | ICD-10-CM

## 2022-08-10 ENCOUNTER — Other Ambulatory Visit: Payer: Self-pay | Admitting: Family Medicine

## 2022-08-10 DIAGNOSIS — E559 Vitamin D deficiency, unspecified: Secondary | ICD-10-CM

## 2022-08-23 ENCOUNTER — Ambulatory Visit: Payer: BC Managed Care – PPO | Admitting: Family Medicine

## 2022-08-23 ENCOUNTER — Encounter: Payer: Self-pay | Admitting: Family Medicine

## 2022-08-23 VITALS — BP 144/106 | HR 75 | Temp 97.6°F | Ht 75.0 in | Wt 228.0 lb

## 2022-08-23 DIAGNOSIS — N3281 Overactive bladder: Secondary | ICD-10-CM | POA: Diagnosis not present

## 2022-08-23 DIAGNOSIS — I10 Essential (primary) hypertension: Secondary | ICD-10-CM

## 2022-08-23 DIAGNOSIS — M5441 Lumbago with sciatica, right side: Secondary | ICD-10-CM | POA: Diagnosis not present

## 2022-08-23 DIAGNOSIS — R350 Frequency of micturition: Secondary | ICD-10-CM | POA: Diagnosis not present

## 2022-08-23 DIAGNOSIS — G8929 Other chronic pain: Secondary | ICD-10-CM

## 2022-08-23 LAB — POCT URINALYSIS DIPSTICK
Bilirubin, UA: NEGATIVE
Blood, UA: NEGATIVE
Glucose, UA: NEGATIVE
Ketones, UA: NEGATIVE
Leukocytes, UA: NEGATIVE
Nitrite, UA: NEGATIVE
Protein, UA: NEGATIVE
Spec Grav, UA: 1.025
Urobilinogen, UA: 0.2 U/dL
pH, UA: 6

## 2022-08-23 MED ORDER — OXYBUTYNIN CHLORIDE ER 5 MG PO TB24: 5.0000 mg | ORAL_TABLET | Freq: Every day | ORAL | 1 refills | Status: DC

## 2022-08-23 MED ORDER — TRIAMCINOLONE ACETONIDE 0.1 % EX CREA: TOPICAL_CREAM | CUTANEOUS | 1 refills | Status: DC

## 2022-08-23 NOTE — Patient Instructions (Addendum)
Your urine is negative today.  I will start you back on mediation for overactive bladder and see how you are doing.   Limit caffeine intake.  Follow up if you develop any new symptoms such as fever, worsening pelvic pain, or any new urinary symptoms.   Your blood pressure is significantly elevated today. Keep an eye on your blood pressure readings and follow up with me in 4 weeks.   I recommend close follow up with your back specialist regarding your back pain and any symptoms associated with this. If you develop any worsening symptoms (numbness, leg weakness, etc) then let Dr. Megan Salon know right away and he may want to order an MRI.      Overactive Bladder, Adult  Overactive bladder is a condition in which a person has a sudden and frequent need to urinate. A person might also leak urine if he or she cannot get to the bathroom fast enough (urinary incontinence). Sometimes, symptoms can interfere with work or social activities. What are the causes? Overactive bladder is associated with poor nerve signals between your bladder and your brain. Your bladder may get the signal to empty before it is full. You may also have very sensitive muscles that make your bladder squeeze too soon. This condition may also be caused by other factors, such as: Medical conditions: Urinary tract infection. Infection of nearby tissues. Prostate enlargement. Bladder stones, inflammation, or tumors. Diabetes. Muscle or nerve weakness, especially from these conditions: A spinal cord injury. Stroke. Multiple sclerosis. Parkinson's disease. Other causes: Surgery on the uterus or urethra. Drinking too much caffeine or alcohol. Certain medicines, especially those that eliminate extra fluid in the body (diuretics). Constipation. What increases the risk? You may be at greater risk for overactive bladder if you: Are an older adult. Smoke. Are going through menopause. Have prostate problems. Have a  neurological disease, such as stroke, dementia, Parkinson's disease, or multiple sclerosis (MS). Eat or drink alcohol, spicy food, caffeine, and other things that irritate the bladder. Are overweight or obese. What are the signs or symptoms? Symptoms of this condition include a sudden, strong urge to urinate. Other symptoms include: Leaking urine. Urinating 8 or more times a day. Waking up to urinate 2 or more times overnight. How is this diagnosed? This condition may be diagnosed based on: Your symptoms and medical history. A physical exam. Blood or urine tests to check for possible causes, such as infection. You may also need to see a health care provider who specializes in urinary tract problems. This is called a urologist. How is this treated? Treatment for overactive bladder depends on the cause of your condition and whether it is mild or severe. Treatment may include: Bladder training, such as: Learning to control the urge to urinate by following a schedule to urinate at regular intervals. Doing Kegel exercises to strengthen the pelvic floor muscles that support your bladder. Special devices, such as: Biofeedback. This uses sensors to help you become aware of your body's signals. Electrical stimulation. This uses electrodes placed inside the body (implanted) or outside the body. These electrodes send gentle pulses of electricity to strengthen the nerves or muscles that control the bladder. Women may use a plastic device, called a pessary, that fits into the vagina and supports the bladder. Medicines, such as: Antibiotics to treat bladder infection. Antispasmodics to stop the bladder from releasing urine at the wrong time. Tricyclic antidepressants to relax bladder muscles. Injections of botulinum toxin type A directly into the bladder tissue to  relax bladder muscles. Surgery, such as: A device may be implanted to help manage the nerve signals that control urination. An electrode  may be implanted to stimulate electrical signals in the bladder. A procedure may be done to change the shape of the bladder. This is done only in very severe cases. Follow these instructions at home: Eating and drinking  Make diet or lifestyle changes recommended by your health care provider. These may include: Drinking fluids throughout the day and not only with meals. Cutting down on caffeine or alcohol. Eating a healthy and balanced diet to prevent constipation. This may include: Choosing foods that are high in fiber, such as beans, whole grains, and fresh fruits and vegetables. Limiting foods that are high in fat and processed sugars, such as fried and sweet foods. Lifestyle  Lose weight if needed. Do not use any products that contain nicotine or tobacco. These include cigarettes, chewing tobacco, and vaping devices, such as e-cigarettes. If you need help quitting, ask your health care provider. General instructions Take over-the-counter and prescription medicines only as told by your health care provider. If you were prescribed an antibiotic medicine, take it as told by your health care provider. Do not stop taking the antibiotic even if you start to feel better. Use any implants or pessary as told by your health care provider. If needed, wear pads to absorb urine leakage. Keep a log to track how much and when you drink, and when you need to urinate. This will help your health care provider monitor your condition. Keep all follow-up visits. This is important. Contact a health care provider if: You have a fever or chills. Your symptoms do not get better with treatment. Your pain and discomfort get worse. You have more frequent urges to urinate. Get help right away if: You are not able to control your bladder. Summary Overactive bladder refers to a condition in which a person has a sudden and frequent need to urinate. Several conditions may lead to an overactive bladder. Treatment  for overactive bladder depends on the cause and severity of your condition. Making lifestyle changes, doing Kegel exercises, keeping a log, and taking medicines can help with this condition. This information is not intended to replace advice given to you by your health care provider. Make sure you discuss any questions you have with your health care provider. Document Revised: 07/13/2020 Document Reviewed: 07/13/2020 Elsevier Patient Education  [place].

## 2022-08-23 NOTE — Assessment & Plan Note (Signed)
UA negative

## 2022-08-23 NOTE — Progress Notes (Signed)
Subjective:     Patient ID: Aaron Page, male    DOB: Jun 13, 1971, 51 y.o.   MRN: [id number]  Chief Complaint  Patient presents with   Back Pain    Was brushing his teeth and back popped and he felt a sharp pain go down his groin and leg, steroid shot was given last tuesday and gabapentin & tylenol refilled.   Urinary Frequency    Accompanied by groin pressure    HPI Patient is in today for urinary frequency with history of OAB.  Nocturia x 5   No hesitancy, urgency, changes in urinary stream. Feels like he is emptying his bladder.   He saw urology in the past- negative work up. He has not been on medication for OAB in 2-3 years.   Pressure in low back and pelvic area since back issue. He has chronic back pain and is under the care of a back specialist in [place].  Pain today is 5/10. Taking gabapentin 300 mg and ibuprofen 800 mg.   States he felt a pop in low back and the sensation traveled to his groin and down the front of his right leg. He saw Dr. Megan Salon, his orthopedist, had X rays yesterday as well as a steroid injection.  Starting PT this week.   Sleep study tomorrow.   Denies fever, chills, dizziness, chest pain, palpitations, shortness of breath, N/V/D, focal weakness.    There are no preventive care reminders to display for this patient.  Past Medical History:  Diagnosis Date   GAD (generalized anxiety disorder) 02/15/2016   Hypertension    Inguinal hernia 03/25/2015   Sickle cell trait [facility])     Past Surgical History:  Procedure Laterality Date   COLONOSCOPY WITH PROPOFOL N/A 11/05/2021   Procedure: COLONOSCOPY WITH PROPOFOL;  Surgeon: Eloise Harman, DO;  Location: [facility];  Service: Endoscopy;  Laterality: N/A;  1:30pm   NO PAST SURGERIES     POLYPECTOMY  11/05/2021   Procedure: POLYPECTOMY;  Surgeon: Eloise Harman, DO;  Location: [facility];  Service: Endoscopy;;    Family History  Problem Relation Age of Onset   Arthritis  Mother    COPD Father    Heart disease Father    Heart failure Father        died at age 52   Colon cancer Neg Hx    Colon polyps Neg Hx     Social History   Socioeconomic History   Marital status: Single    Spouse name: Not on file   Number of children: Not on file   Years of education: Not on file   Highest education level: Not on file  Occupational History   Not on file  Tobacco Use   Smoking status: Never   Smokeless tobacco: Never  Vaping Use   Vaping Use: Never used  Substance and Sexual Activity   Alcohol use: Yes    Comment: drinks approx 3 times/month; beer, mixed drinks   Drug use: No   Sexual activity: Yes  Other Topics Concern   Not on file  Social History Narrative   Not on file   Social Determinants of Health   Financial Resource Strain: Low Risk  (12/11/2019)   Overall Financial Resource Strain (CARDIA)    Difficulty of Paying Living Expenses: Not hard at all  Food Insecurity: No Food Insecurity (12/11/2019)   Hunger Vital Sign    Worried About Running Out of Food in the Last Year: Never  true    Ran Out of Food in the Last Year: Never true  Transportation Needs: No Transportation Needs (12/11/2019)   PRAPARE - Hydrologist (Medical): No    Lack of Transportation (Non-Medical): No  Physical Activity: Sufficiently Active (12/11/2019)   Exercise Vital Sign    Days of Exercise per Week: 5 days    Minutes of Exercise per Session: 30 min  Stress: No Stress Concern Present (12/11/2019)   [place]    Feeling of Stress : Not at all  Social Connections: Socially Integrated (12/11/2019)   Social Connection and Isolation Panel [NHANES]    Frequency of Communication with Friends and Family: Three times a week    Frequency of Social Gatherings with Friends and Family: Once a week    Attends Religious Services: 1 to 4 times per year    Active Member of [employer] or Organizations:  Yes    Attends Music therapist: More than 4 times per year    Marital Status: Married  Human resources officer Violence: Not At Risk (12/11/2019)   Humiliation, Afraid, Rape, and Kick questionnaire    Fear of Current or Ex-Partner: No    Emotionally Abused: No    Physically Abused: No    Sexually Abused: No    Outpatient Medications Prior to Visit  Medication Sig Dispense Refill   gabapentin (NEURONTIN) 300 MG capsule Take 300 mg by mouth every 8 (eight) hours as needed (pain).     ibuprofen (ADVIL) 800 MG tablet Take 800 mg by mouth every 8 (eight) hours as needed for moderate pain.     Multiple Vitamin (MULTIVITAMIN WITH MINERALS) TABS tablet Take 1 tablet by mouth daily. 90 tablet 1   Omega 3 1000 MG CAPS Take 1 capsule (1,000 mg total) by mouth 2 (two) times daily. (Patient taking differently: Take 1,000 mg by mouth daily.) 180 capsule 3   Vitamin D, Ergocalciferol, (DRISDOL) 1.25 MG (50000 UNIT) CAPS capsule Take 1 capsule (50,000 Units total) by mouth every 7 (seven) days. 8 capsule 0   triamcinolone cream (KENALOG) 0.1 % APPLY TO AFFECTED AREA TWICE A DAY (Patient taking differently: 1 application  2 (two) times daily as needed (rash). APPLY TO AFFECTED AREA TWICE A DAY) 30 g 3   No facility-administered medications prior to visit.    Allergies  Allergen Reactions   Penicillins Anaphylaxis, Swelling and Rash    ROS     Objective:    Physical Exam Constitutional:      General: He is not in acute distress.    Appearance: He is not ill-appearing.  Cardiovascular:     Rate and Rhythm: Normal rate and regular rhythm.     Pulses: Normal pulses.     Heart sounds: Normal heart sounds.  Pulmonary:     Effort: Pulmonary effort is normal.     Breath sounds: Normal breath sounds.  Abdominal:     General: There is no distension.     Tenderness: There is no right CVA tenderness or left CVA tenderness.  Skin:    General: Skin is warm and dry.  Neurological:     Mental  Status: He is alert.     Cranial Nerves: No cranial nerve deficit.     Motor: No weakness.  Psychiatric:        Mood and Affect: Mood normal.        Behavior: Behavior normal.  Thought Content: Thought content normal.     BP (!) 144/106 (BP Location: Left Arm, Patient Position: Sitting, Cuff Size: Large)   Pulse 75   Temp 97.6 F (36.4 C) (Temporal)   Ht '6\' 3"'$  (1.905 m)   Wt 228 lb (103.4 kg)   SpO2 98%   BMI 28.50 kg/m  Wt Readings from Last 3 Encounters:  08/23/22 228 lb (103.4 kg)  06/22/22 222 lb (100.7 kg)  05/11/22 222 lb (100.7 kg)       Assessment & Plan:   Problem List Items Addressed This Visit       Cardiovascular and Mediastinum   Essential hypertension    BP has been stable off of medication. Elevated BP may be related to pain today. He has also had Red Bull in the past couple of days.  Advised him to keep an eye on BP at home. Limit caffeine and sodium.  Follow up in 4 wks or sooner if needed.         Genitourinary   OAB (overactive bladder) - Primary    Recent labs done including normal PSA.  UA negative today.  Start back on medication for OAB, oxybutynin 5 mg daily. Avoid caffeine.  Follow up in 4 wks or sooner if needed.  Refer back to urology if needed.       Relevant Medications   oxybutynin (DITROPAN-XL) 5 MG 24 hr tablet     Other   Chronic back pain   Frequent urination    UA negative.       Relevant Orders   POCT urinalysis dipstick (Completed)    I am having Reece Levy start on oxybutynin. I am also having him maintain his gabapentin, Omega 3, ibuprofen, Vitamin D (Ergocalciferol), multivitamin with minerals, and triamcinolone cream.  Meds ordered this encounter  Medications   oxybutynin (DITROPAN-XL) 5 MG 24 hr tablet    Sig: Take 1 tablet (5 mg total) by mouth at bedtime.    Dispense:  30 tablet    Refill:  1    Order Specific Question:   Supervising Provider    Answer:   Pricilla Holm A [[id number]]    triamcinolone cream (KENALOG) 0.1 %    Sig: APPLY TO AFFECTED AREA TWICE A DAY    Dispense:  30 g    Refill:  1

## 2022-08-23 NOTE — Assessment & Plan Note (Addendum)
BP has been stable off of medication. Elevated BP may be related to pain today. He has also had Red Bull in the past couple of days.  Advised him to keep an eye on BP at home. Limit caffeine and sodium.  Follow up in 4 wks or sooner if needed.

## 2022-08-23 NOTE — Assessment & Plan Note (Signed)
Recent labs done including normal PSA.  UA negative today.  Start back on medication for OAB, oxybutynin 5 mg daily. Avoid caffeine.  Follow up in 4 wks or sooner if needed.  Refer back to urology if needed.

## 2022-09-16 ENCOUNTER — Other Ambulatory Visit: Payer: Self-pay | Admitting: Family Medicine

## 2022-09-16 DIAGNOSIS — N3281 Overactive bladder: Secondary | ICD-10-CM

## 2022-09-25 ENCOUNTER — Other Ambulatory Visit: Payer: Self-pay | Admitting: Family Medicine

## 2022-09-25 DIAGNOSIS — E559 Vitamin D deficiency, unspecified: Secondary | ICD-10-CM

## 2022-11-08 ENCOUNTER — Ambulatory Visit: Payer: BC Managed Care – PPO | Admitting: Podiatry

## 2022-11-08 ENCOUNTER — Ambulatory Visit (INDEPENDENT_AMBULATORY_CARE_PROVIDER_SITE_OTHER): Payer: BC Managed Care – PPO

## 2022-11-08 DIAGNOSIS — M19072 Primary osteoarthritis, left ankle and foot: Secondary | ICD-10-CM

## 2022-11-08 DIAGNOSIS — M21612 Bunion of left foot: Secondary | ICD-10-CM | POA: Diagnosis not present

## 2022-11-08 DIAGNOSIS — M7750 Other enthesopathy of unspecified foot: Secondary | ICD-10-CM

## 2022-11-08 DIAGNOSIS — M2012 Hallux valgus (acquired), left foot: Secondary | ICD-10-CM | POA: Diagnosis not present

## 2022-11-08 DIAGNOSIS — M7752 Other enthesopathy of left foot: Secondary | ICD-10-CM | POA: Diagnosis not present

## 2022-11-08 NOTE — Progress Notes (Signed)
  Subjective:  Patient ID: Aaron Page, male    DOB: 08-31-1971,  MRN: [id number]  Chief Complaint  Patient presents with   Foot Pain    NP- L foot has sharp pain.    52 y.o. male presents with the above complaint. History confirmed with patient.  He thinks he may have injured this toe long ago when he was a teenager he was in a splint or walking boot at some point for it  Objective:  Physical Exam: warm, good capillary refill, no trophic changes or ulcerative lesions, normal DP and PT pulses, normal sensory exam, and pain on palpation to dorsal lateral portion of MTPJ.  He has hallux valgus deformity with bunions.  Smooth pain-free range of motion   Radiographs: Multiple views x-ray of the left foot: Exostosis or possible old avulsion fracture noted on the dorsal lateral portion of proximal phalanx of first MTPJ.  Hallux valgus noted with bunions, good joint space overall Assessment:   1. Arthritis of first metatarsophalangeal (MTP) joint of left foot   2. Hallux valgus with bunions, left      Plan:  Patient was evaluated and treated and all questions answered.  We reviewed his radiographs and discussed the presence of the old injury and he is likely beginning to get arthritic changes as sequela of this.  We discussed further treatment of this including surgical intervention and injection therapy.  I recommended injection today and corticosteroid injection of 10 mg of Kenalog and 2 mg of dexamethasone with 0.5 cc of 0.5% Marcaine plain was injected into the first MTPJ from a dorsal lateral approach following prep with Betadine.  He tolerated this well.  Discussed eventual surgical correction of this we could consider removal of the small avulsion fragment prior to arthrodesis or arthroplasty.  He tolerated the injection well and he will return as needed if it worsens or does not improve.  Return if symptoms worsen or fail to improve.

## 2022-12-21 ENCOUNTER — Ambulatory Visit: Payer: BC Managed Care – PPO | Admitting: Family Medicine

## 2023-01-04 ENCOUNTER — Ambulatory Visit (INDEPENDENT_AMBULATORY_CARE_PROVIDER_SITE_OTHER): Payer: BC Managed Care – PPO

## 2023-01-04 ENCOUNTER — Ambulatory Visit: Payer: BC Managed Care – PPO | Admitting: Family Medicine

## 2023-01-04 ENCOUNTER — Encounter: Payer: Self-pay | Admitting: Family Medicine

## 2023-01-04 VITALS — BP 138/86 | HR 65 | Temp 97.6°F | Ht 75.0 in | Wt 224.0 lb

## 2023-01-04 DIAGNOSIS — K5900 Constipation, unspecified: Secondary | ICD-10-CM

## 2023-01-04 DIAGNOSIS — R140 Abdominal distension (gaseous): Secondary | ICD-10-CM | POA: Insufficient documentation

## 2023-01-04 DIAGNOSIS — I10 Essential (primary) hypertension: Secondary | ICD-10-CM | POA: Diagnosis not present

## 2023-01-04 DIAGNOSIS — R10821 Right upper quadrant rebound abdominal tenderness: Secondary | ICD-10-CM

## 2023-01-04 DIAGNOSIS — R222 Localized swelling, mass and lump, trunk: Secondary | ICD-10-CM

## 2023-01-04 DIAGNOSIS — M545 Low back pain, unspecified: Secondary | ICD-10-CM | POA: Insufficient documentation

## 2023-01-04 LAB — COMPREHENSIVE METABOLIC PANEL
ALT: 21 U/L (ref 0–53)
AST: 27 U/L (ref 0–37)
Albumin: 4 g/dL (ref 3.5–5.2)
Alkaline Phosphatase: 53 U/L (ref 39–117)
BUN: 12 mg/dL (ref 6–23)
CO2: 27 mEq/L (ref 19–32)
Calcium: 9.3 mg/dL (ref 8.4–10.5)
Chloride: 105 mEq/L (ref 96–112)
Creatinine, Ser: 1.02 mg/dL (ref 0.40–1.50)
GFR: 84.7 mL/min (ref >60.00)
Glucose, Bld: 93 mg/dL (ref 70–99)
Potassium: 4.1 mEq/L (ref 3.5–5.1)
Sodium: 139 mEq/L (ref 135–145)
Total Bilirubin: 0.6 mg/dL (ref 0.2–1.2)
Total Protein: 7.1 g/dL (ref 6.0–8.3)

## 2023-01-04 LAB — CBC WITH DIFFERENTIAL/PLATELET
Basophils Absolute: 0 10*3/uL (ref 0.0–0.1)
Basophils Relative: 0.8 % (ref 0.0–3.0)
Eosinophils Absolute: 0.1 10*3/uL (ref 0.0–0.7)
Eosinophils Relative: 1.4 % (ref 0.0–5.0)
HCT: 43.6 % (ref 39.0–52.0)
Hemoglobin: 14.3 g/dL (ref 13.0–17.0)
Lymphocytes Relative: 36.3 % (ref 12.0–46.0)
Lymphs Abs: 1.4 10*3/uL (ref 0.7–4.0)
MCHC: 32.8 g/dL (ref 30.0–36.0)
MCV: 80.9 fl (ref 78.0–100.0)
Monocytes Absolute: 0.3 10*3/uL (ref 0.1–1.0)
Monocytes Relative: 7.5 % (ref 3.0–12.0)
Neutro Abs: 2.1 10*3/uL (ref 1.4–7.7)
Neutrophils Relative %: 54 % (ref 43.0–77.0)
Platelets: 255 10*3/uL (ref 150.0–400.0)
RBC: 5.39 Mil/uL (ref 4.22–5.81)
RDW: 14.2 % (ref 11.5–15.5)
WBC: 3.9 10*3/uL — ABNORMAL LOW (ref 4.0–10.5)

## 2023-01-04 LAB — LIPASE: Lipase: 23 U/L (ref 11.0–59.0)

## 2023-01-04 LAB — AMYLASE: Amylase: 68 U/L (ref 27–131)

## 2023-01-04 LAB — TSH: TSH: 0.9 u[IU]/mL (ref 0.35–5.50)

## 2023-01-04 NOTE — Assessment & Plan Note (Signed)
No postprandial bloating. Abdominal plain films ordered. Check labs including pancreatic enzymes. RUQ [place] ordered.

## 2023-01-04 NOTE — Assessment & Plan Note (Signed)
No acute distress. Abdominal exam non surgical. Plain film ordered. If negative, he will start Miralax regimen and titrate. Discussed adding fiber, increasing hydration and follow up in 4 weeks. Reviewed colonoscopy report.

## 2023-01-04 NOTE — Assessment & Plan Note (Signed)
Appears to be benign skin mass. We will monitor for changes.

## 2023-01-04 NOTE — Progress Notes (Signed)
Subjective:     Patient ID: Aaron Page, male    DOB: 1971-10-30, 52 y.o.   MRN: [id number]  Chief Complaint  Patient presents with   Follow-up    6 month f/u    HPI Patient is in today for follow up on HTN. He does not check his BP outside of here.  Years ago he was on BP medication.  Terazosin?   C/o constipation x 3 months. Having bowel movements daily but hard stools and does not feel like he is completely emptying.  Drinking prune juice. Stopped ibuprofen.  He has right low back pain with  Some bloating. No postprandial pain.   No fever, chills, dizziness, chest pain, palpitations, shortness of breath, abdominal pain, N/V/D.    Maybe not drinking enough water. ?fiber intake.   Last colonoscopy 2 years ago and due for 10 yr recall.   He has taken OTC laxatives.   [facility]   Vitamin D supplement OTC   He also sees the [place] in [place].   Health Maintenance Due  Topic Date Due   Zoster Vaccines- Shingrix (1 of 2) Never done    Past Medical History:  Diagnosis Date   GAD (generalized anxiety disorder) 02/15/2016   Hypertension    Inguinal hernia 03/25/2015   Sickle cell trait (Hermosa)     Past Surgical History:  Procedure Laterality Date   COLONOSCOPY WITH PROPOFOL N/A 11/05/2021   Procedure: COLONOSCOPY WITH PROPOFOL;  Surgeon: Eloise Harman, DO;  [facility];  Service: Endoscopy;  Laterality: N/A;  1:30pm   NO PAST SURGERIES     POLYPECTOMY  11/05/2021   Procedure: POLYPECTOMY;  Surgeon: Eloise Harman, DO;  Location: [facility];  Service: Endoscopy;;    Family History  Problem Relation Age of Onset   Arthritis Mother    COPD Father    Heart disease Father    Heart failure Father        died at age 26   Colon cancer Neg Hx    Colon polyps Neg Hx     Social History   Socioeconomic History   Marital status: Single    Spouse name: Not on file   Number of children: Not on file    Years of education: Not on file   Highest education level: Not on file  Occupational History   Not on file  Tobacco Use   Smoking status: Never   Smokeless tobacco: Never  Vaping Use   Vaping Use: Never used  Substance and Sexual Activity   Alcohol use: Yes    Comment: drinks approx 3 times/month; beer, mixed drinks   Drug use: No   Sexual activity: Yes  Other Topics Concern   Not on file  Social History Narrative   Not on file   Social Determinants of Health   Financial Resource Strain: Low Risk  (12/11/2019)   Overall Financial Resource Strain (CARDIA)    Difficulty of Paying Living Expenses: Not hard at all  Food Insecurity: No Food Insecurity (12/11/2019)   Hunger Vital Sign    Worried About Running Out of Food in the Last Year: Never true    [place] in the Last Year: Never true  Transportation Needs: No Transportation Needs (12/11/2019)   PRAPARE - Hydrologist (Medical): No    Lack of Transportation (Non-Medical): No  Physical Activity: Sufficiently Active (12/11/2019)  Exercise Vital Sign    Days of Exercise per Week: 5 days    Minutes of Exercise per Session: 30 min  Stress: No Stress Concern Present (12/11/2019)   [place]    Feeling of Stress : Not at all  Social Connections: Socially Integrated (12/11/2019)   Social Connection and Isolation Panel [NHANES]    Frequency of Communication with Friends and Family: Three times a week    Frequency of Social Gatherings with Friends and Family: Once a week    Attends Religious Services: 1 to 4 times per year    Active Member of [employer] or Organizations: Yes    Attends Music therapist: More than 4 times per year    Marital Status: Married  Human resources officer Violence: Not At Risk (12/11/2019)   Humiliation, Afraid, Rape, and Kick questionnaire    Fear of Current or Ex-Partner: No    Emotionally Abused: No     Physically Abused: No    Sexually Abused: No    Outpatient Medications Prior to Visit  Medication Sig Dispense Refill   cholecalciferol (VITAMIN D3) 25 MCG (1000 UNIT) tablet Take 1,000 Units by mouth daily.     gabapentin (NEURONTIN) 300 MG capsule Take 300 mg by mouth every 8 (eight) hours as needed (pain).     ibuprofen (ADVIL) 800 MG tablet Take 800 mg by mouth every 8 (eight) hours as needed for moderate pain.     Multiple Vitamin (MULTIVITAMIN WITH MINERALS) TABS tablet Take 1 tablet by mouth daily. 90 tablet 1   Omega 3 1000 MG CAPS Take 1 capsule (1,000 mg total) by mouth 2 (two) times daily. (Patient taking differently: Take 1,000 mg by mouth daily.) 180 capsule 3   oxybutynin (DITROPAN-XL) 5 MG 24 hr tablet Take 1 tablet (5 mg total) by mouth at bedtime. 30 tablet 1   triamcinolone cream (KENALOG) 0.1 % APPLY TO AFFECTED AREA TWICE A DAY 30 g 1   Vitamin D, Ergocalciferol, (DRISDOL) 1.25 MG (50000 UNIT) CAPS capsule Take 1 capsule (50,000 Units total) by mouth every 7 (seven) days. (Patient not taking: Reported on 01/04/2023) 8 capsule 0   No facility-administered medications prior to visit.    Allergies  Allergen Reactions   Penicillins Anaphylaxis, Swelling and Rash    ROS     Objective:    Physical Exam Constitutional:      General: He is not in acute distress.    Appearance: He is not ill-appearing.  Eyes:     Extraocular Movements: Extraocular movements intact.     Conjunctiva/sclera: Conjunctivae normal.  Cardiovascular:     Rate and Rhythm: Normal rate and regular rhythm.  Pulmonary:     Effort: Pulmonary effort is normal.     Breath sounds: Normal breath sounds.  Abdominal:     General: Bowel sounds are normal. There is no distension.     Palpations: Abdomen is soft.     Tenderness: There is abdominal tenderness in the right upper quadrant. There is no right CVA tenderness, left CVA tenderness, guarding or rebound. Negative signs include Murphy's sign and  McBurney's sign.  Musculoskeletal:        General: Normal range of motion.  Skin:    General: Skin is warm and dry.          Comments: Soft, round smooth moveable mass of left low back, tender   Neurological:     General: No focal deficit present.  Mental Status: He is alert and oriented to person, place, and time.  Psychiatric:        Mood and Affect: Mood normal.        Behavior: Behavior normal.        Thought Content: Thought content normal.     BP 138/86   Pulse 65   Temp 97.6 F (36.4 C) (Temporal)   Ht '6\' 3"'$  (1.905 m)   Wt 224 lb (101.6 kg)   SpO2 96%   BMI 28.00 kg/m  Wt Readings from Last 3 Encounters:  01/04/23 224 lb (101.6 kg)  08/23/22 228 lb (103.4 kg)  06/22/22 222 lb (100.7 kg)        Assessment & Plan:   Problem List Items Addressed This Visit       Cardiovascular and Mediastinum   Essential hypertension     Elevated BP may be related to pain today. He has also had coffee this morning.   Advised him to keep an eye on BP at home. Limit caffeine and sodium.  Follow up in 4 wks. Check labs. Consider BP medication at follow up if BP elevated at home      Relevant Orders   CBC with Differential/Platelet (Completed)   Comprehensive metabolic panel (Completed)   TSH (Completed)     Other   Abdominal bloating    No postprandial bloating. Abdominal plain films ordered. Check labs including pancreatic enzymes. RUQ [place] ordered.       Relevant Orders   Lipase (Completed)   Amylase (Completed)   DG Abd 2 Views (Completed)   US Abdomen Limited RUQ (LIVER/GB)   Acute left-sided low back pain without sciatica    Discussed that low back pain appears to be MSK related. Unlikely mass of skin related but he will monitor for changes.       Constipation - Primary    No acute distress. Abdominal exam non surgical. Plain film ordered. If negative, he will start Miralax regimen and titrate. Discussed adding fiber, increasing hydration and follow up in 4  weeks. Reviewed colonoscopy report.       Relevant Orders   CBC with Differential/Platelet (Completed)   Comprehensive metabolic panel (Completed)   Mass of skin of back    Appears to be benign skin mass. We will monitor for changes.       Right upper quadrant abdominal tenderness with rebound tenderness    Check labs and RUQ [place]. Follow up if worsening or pending results.       Relevant Orders   Lipase (Completed)   Amylase (Completed)   DG Abd 2 Views (Completed)   US Abdomen Limited RUQ (LIVER/GB)    I have discontinued Mikeal Hawthorne Patti's Vitamin D (Ergocalciferol). I am also having him maintain his gabapentin, Omega 3, ibuprofen, multivitamin with minerals, oxybutynin, triamcinolone cream, and cholecalciferol.  No orders of the defined types were placed in this encounter.

## 2023-01-04 NOTE — Patient Instructions (Addendum)
Please go downstairs for labs and abdominal x-ray before you leave.  As long as your x-ray is negative then I recommend taking over-the-counter MiraLAX 1 capful twice daily with at least 6 to 8 ounces of fluid for the next 3 days.  You can then titrate accordingly to keep your stools regular.  You may only need 1 capful daily or even a half a capful daily going forward.  Make sure you have adequate fiber in your diet or else you can start taking over-the-counter fiber.  Stay well-hydrated.  If you develop fever, chills, vomiting, worsening abdominal pain then stop taking MiraLAX and follow-up.  You will hear from [facility] imaging to schedule an abdominal ultrasound.  Please check your blood pressures daily for the next 4 weeks and follow-up with me.  Bring in your blood pressure machine and readings.  GOAL BLOOD PRESSURE IS 130/80 OR LOWER    DASH Eating Plan DASH stands for Dietary Approaches to Stop Hypertension. The DASH eating plan is a healthy eating plan that has been shown to: Reduce high blood pressure (hypertension). Reduce your risk for type 2 diabetes, heart disease, and stroke. Help with weight loss. What are tips for following this plan? Reading food labels Check food labels for the amount of salt (sodium) per serving. Choose foods with less than 5 percent of the Daily Value of sodium. Generally, foods with less than 300 milligrams (mg) of sodium per serving fit into this eating plan. To find whole grains, look for the word "whole" as the first word in the ingredient list. Shopping Buy products labeled as "low-sodium" or "no salt added." Buy fresh foods. Avoid canned foods and pre-made or frozen meals. Cooking Avoid adding salt when cooking. Use salt-free seasonings or herbs instead of table salt or sea salt. Check with your health care provider or pharmacist before using salt substitutes. Do not fry foods. Cook foods using healthy methods such as baking, boiling,  grilling, roasting, and broiling instead. Cook with heart-healthy oils, such as olive, canola, avocado, soybean, or sunflower oil. Meal planning  Eat a balanced diet that includes: 4 or more servings of fruits and 4 or more servings of vegetables each day. Try to fill one-half of your plate with fruits and vegetables. 6-8 servings of whole grains each day. Less than 6 oz (170 g) of lean meat, poultry, or fish each day. A 3-oz (85-g) serving of meat is about the same size as a deck of cards. One egg equals 1 oz (28 g). 2-3 servings of low-fat dairy each day. One serving is 1 cup (237 mL). 1 serving of nuts, seeds, or beans 5 times each week. 2-3 servings of heart-healthy fats. Healthy fats called omega-3 fatty acids are found in foods such as walnuts, flaxseeds, fortified milks, and eggs. These fats are also found in cold-water fish, such as sardines, salmon, and mackerel. Limit how much you eat of: Canned or prepackaged foods. Food that is high in trans fat, such as some fried foods. Food that is high in saturated fat, such as fatty meat. Desserts and other sweets, sugary drinks, and other foods with added sugar. Full-fat dairy products. Do not salt foods before eating. Do not eat more than 4 egg yolks a week. Try to eat at least 2 vegetarian meals a week. Eat more home-cooked food and less restaurant, buffet, and fast food. Lifestyle When eating at a restaurant, ask that your food be prepared with less salt or no salt, if possible. If you  drink alcohol: Limit how much you use to: 0-1 drink a day for women who are not pregnant. 0-2 drinks a day for men. Be aware of how much alcohol is in your drink. In the [place] one drink equals one 12 oz bottle of beer (355 mL), one 5 oz glass of wine (148 mL), or one 1 oz glass of hard liquor (44 mL). General information Avoid eating more than 2,300 mg of salt a day. If you have hypertension, you may need to reduce your sodium intake to 1,500 mg a  day. Work with your health care provider to maintain a healthy body weight or to lose weight. Ask what an ideal weight is for you. Get at least 30 minutes of exercise that causes your heart to beat faster (aerobic exercise) most days of the week. Activities may include walking, swimming, or biking. Work with your health care provider or dietitian to adjust your eating plan to your individual calorie needs. What foods should I eat? Fruits All fresh, dried, or frozen fruit. Canned fruit in natural juice (without added sugar). Vegetables Fresh or frozen vegetables (raw, steamed, roasted, or grilled). Low-sodium or reduced-sodium tomato and vegetable juice. Low-sodium or reduced-sodium tomato sauce and tomato paste. Low-sodium or reduced-sodium canned vegetables. Grains Whole-grain or whole-wheat bread. Whole-grain or whole-wheat pasta. Brown rice. [place]. Bulgur. Whole-grain and low-sodium cereals. Pita bread. Low-fat, low-sodium crackers. Whole-wheat flour tortillas. Meats and other proteins Skinless chicken or [place]. Ground chicken or [place]. Pork with fat trimmed off. Fish and seafood. Egg whites. Dried beans, peas, or lentils. Unsalted nuts, nut butters, and seeds. Unsalted canned beans. Lean cuts of beef with fat trimmed off. Low-sodium, lean precooked or cured meat, such as sausages or meat loaves. Dairy Low-fat (1%) or fat-free (skim) milk. Reduced-fat, low-fat, or fat-free cheeses. Nonfat, low-sodium ricotta or cottage cheese. Low-fat or nonfat yogurt. Low-fat, low-sodium cheese. Fats and oils Soft margarine without trans fats. Vegetable oil. Reduced-fat, low-fat, or light mayonnaise and salad dressings (reduced-sodium). Canola, safflower, olive, avocado, soybean, and sunflower oils. Avocado. Seasonings and condiments Herbs. Spices. Seasoning mixes without salt. Other foods Unsalted popcorn and pretzels. Fat-free sweets. The items listed above may not be a complete list of foods  and beverages you can eat. Contact a dietitian for more information. What foods should I avoid? Fruits Canned fruit in a light or heavy syrup. Fried fruit. Fruit in cream or butter sauce. Vegetables Creamed or fried vegetables. Vegetables in a cheese sauce. Regular canned vegetables (not low-sodium or reduced-sodium). Regular canned tomato sauce and paste (not low-sodium or reduced-sodium). Regular tomato and vegetable juice (not low-sodium or reduced-sodium). Angie Fava. Olives. Grains Baked goods made with fat, such as croissants, muffins, or some breads. Dry pasta or rice meal packs. Meats and other proteins Fatty cuts of meat. Ribs. Fried meat. Berniece Salines. Bologna, salami, and other precooked or cured meats, such as sausages or meat loaves. Fat from the back of a pig (fatback). Bratwurst. Salted nuts and seeds. Canned beans with added salt. Canned or smoked fish. Whole eggs or egg yolks. Chicken or [place] with skin. Dairy Whole or 2% milk, cream, and half-and-half. Whole or full-fat cream cheese. Whole-fat or sweetened yogurt. Full-fat cheese. Nondairy creamers. Whipped toppings. Processed cheese and cheese spreads. Fats and oils Butter. Stick margarine. Lard. Shortening. Ghee. Bacon fat. Tropical oils, such as coconut, palm kernel, or palm oil. Seasonings and condiments Onion salt, garlic salt, seasoned salt, table salt, and sea salt. Worcestershire sauce. Tartar sauce. Barbecue sauce. Teriyaki sauce.  Soy sauce, including reduced-sodium. Steak sauce. Canned and packaged gravies. Fish sauce. Oyster sauce. Cocktail sauce. Store-bought horseradish. Ketchup. Mustard. Meat flavorings and tenderizers. Bouillon cubes. Hot sauces. Pre-made or packaged marinades. Pre-made or packaged taco seasonings. Relishes. Regular salad dressings. Other foods Salted popcorn and pretzels. The items listed above may not be a complete list of foods and beverages you should avoid. Contact a dietitian for more  information. Where to find more information National Heart, Lung, and Blood Institute: https://wilson-eaton.com/ American Heart Association: www.heart.org Academy of Nutrition and Dietetics: www.eatright.[place]: www.kidney.org Summary The DASH eating plan is a healthy eating plan that has been shown to reduce high blood pressure (hypertension). It may also reduce your risk for type 2 diabetes, heart disease, and stroke. When on the DASH eating plan, aim to eat more fresh fruits and vegetables, whole grains, lean proteins, low-fat dairy, and heart-healthy fats. With the DASH eating plan, you should limit salt (sodium) intake to 2,300 mg a day. If you have hypertension, you may need to reduce your sodium intake to 1,500 mg a day. Work with your health care provider or dietitian to adjust your eating plan to your individual calorie needs. This information is not intended to replace advice given to you by your health care provider. Make sure you discuss any questions you have with your health care provider. Document Revised: 09/27/2019 Document Reviewed: 09/27/2019 Elsevier Patient Education  [place].

## 2023-01-04 NOTE — Assessment & Plan Note (Signed)
Check labs and RUQ [place]. Follow up if worsening or pending results.

## 2023-01-04 NOTE — Assessment & Plan Note (Signed)
Elevated BP may be related to pain today. He has also had coffee this morning.   Advised him to keep an eye on BP at home. Limit caffeine and sodium.  Follow up in 4 wks. Check labs. Consider BP medication at follow up if BP elevated at home

## 2023-01-04 NOTE — Assessment & Plan Note (Signed)
Discussed that low back pain appears to be MSK related. Unlikely mass of skin related but he will monitor for changes.

## 2023-01-18 ENCOUNTER — Ambulatory Visit
Admission: RE | Admit: 2023-01-18 | Discharge: 2023-01-18 | Disposition: A | Discharge status: Home / Self Care | Payer: BC Managed Care – PPO | Source: Ambulatory Visit | Attending: Family Medicine | Admitting: Family Medicine

## 2023-01-18 ENCOUNTER — Encounter: Payer: Self-pay | Admitting: Family Medicine

## 2023-01-18 DIAGNOSIS — R140 Abdominal distension (gaseous): Secondary | ICD-10-CM

## 2023-01-18 DIAGNOSIS — K5900 Constipation, unspecified: Secondary | ICD-10-CM

## 2023-01-18 DIAGNOSIS — R10821 Right upper quadrant rebound abdominal tenderness: Secondary | ICD-10-CM

## 2023-01-18 NOTE — Telephone Encounter (Signed)
Pt requesting "ultrasound of my intestines stomach area" as he is still having constipation feeling

## 2023-01-26 ENCOUNTER — Other Ambulatory Visit: Payer: Self-pay | Admitting: Family Medicine

## 2023-02-01 NOTE — Telephone Encounter (Signed)
FYI... no action needed, pt just wanted to make you aware of complaint regarding recent 01/19/23 GI referral

## 2023-02-06 ENCOUNTER — Other Ambulatory Visit: Payer: Self-pay | Admitting: Family Medicine

## 2023-02-06 NOTE — Progress Notes (Unsigned)
GI Office Note    Referring Provider: Girtha Rm, NP-C Primary Care Physician:  Girtha Rm, NP-C Primary Gastroenterologist: Elon Alas. Abbey Chatters, DO  Date:  02/07/2023  ID:  Aaron Page, DOB 05/30/1971, MRN [id number]   Chief Complaint   Chief Complaint  Patient presents with   Abdominal Pain    Patient here today with concerns over right sided pain. Patient says the pain starts in lower back and radiates around to the RLQ. He also has issues with constipation. He is taking warm prune juice and also epsom salt water. He has used Miralax which did not help,so he stopped. He is also having issues with abdominal bloating.    History of Present Illness  Aaron Page is a 52 y.o. male with a history of Anxiety, HTN, inguinal hernia presenting today with complaint of right upper quadrant tenderness, constipation, abdominal bloating  Last seen in the office 09/27/2021.  Presented for consideration of colonoscopy.  Was noted to have had a prior colonoscopy about 10 years ago for diarrhea.  Was diagnosed with IBS.  Stated diarrhea resolved after getting out of a bad relationship.  Followed by urology for overactive bladder.  Has 1-2 stools per day, incomplete at times.  Chronic back issues at L4-L5.  Drinking warm prune juice to help with bowel movements.  Denies any melena or BRBPR.  Advised to start MiraLAX twice daily until complete bowel movement to reduce to once daily.  Scheduled for colonoscopy.  Colonoscopy December 2022: -3 mm ascending colon polyp -5 mm sigmoid colon polyp -Pathology revealed tubular adenomas -Repeat recommended in 5 years  Labs 01/04/2023: Normal CBC, CMP, TSH, and lipase.  KUB 01/04/23: -Non obstructive bowel gas pattern -mild stool burden within the colon  RUQ [place] 01/18/23: -Normal   Today:  Constipation - struggling for about 4 months. Having a BM about once per day. Uses epsom salt warm water and prune juice. Does not feel like he is  emptying all the way. Will not try to strain. Will sit and wait. Has tried correct all. Will have the feeling to go and then when he gets the bathroom it just stops. Has tried miralax daily in the past in his water and has tried twice daily without much relief so he stopped. Feels like the prune juice gave him better results. Has tried enemas as well. Has not tried suppositories.   Has tried to increase fiber in his diet with ruffage and fiber bars. Tries arugaula, spinach, and kale. Uses granola daily and eats Activia yogurt. Uses yogurt about 2-3 times out of the week. Has bloating as well but it is on and off and not daily. Does the prune juice at night to help more with the bloating.   Denies any lack of appetite, melena, or BRBPR, unintentional weight loss.  Reports he normal has tenderness to the lower right side of his back and it radiates to his RLQ.    Current Outpatient Medications  Medication Sig Dispense Refill   acetaminophen (TYLENOL) 325 MG tablet Take 650 mg by mouth every 6 (six) hours as needed.     gabapentin (NEURONTIN) 300 MG capsule Take 300 mg by mouth every 8 (eight) hours as needed (pain).     ibuprofen (ADVIL) 800 MG tablet Take 800 mg by mouth every 8 (eight) hours as needed for moderate pain.     Multiple Vitamin (MULTIVITAMIN WITH MINERALS) TABS tablet Take 1 tablet by mouth daily. 90 tablet 1   Omega  3 1000 MG CAPS Take 1 capsule (1,000 mg total) by mouth 2 (two) times daily. (Patient taking differently: Take 1,000 mg by mouth daily.) 180 capsule 3   OVER THE COUNTER MEDICATION Vit D once per day.     triamcinolone cream (KENALOG) 0.1 % APPLY TO AFFECTED AREA TWICE A DAY 30 g 1   No current facility-administered medications for this visit.    Past Medical History:  Diagnosis Date   GAD (generalized anxiety disorder) 02/15/2016   Hypertension    Inguinal hernia 03/25/2015   Sickle cell trait     Past Surgical History:  Procedure Laterality Date   COLONOSCOPY  WITH PROPOFOL N/A 11/05/2021   Procedure: COLONOSCOPY WITH PROPOFOL;  Surgeon: Eloise Harman, DO;  Location: [facility];  Service: Endoscopy;  Laterality: N/A;  1:30pm   NO PAST SURGERIES     POLYPECTOMY  11/05/2021   Procedure: POLYPECTOMY;  Surgeon: Eloise Harman, DO;  Location: [facility];  Service: Endoscopy;;    Family History  Problem Relation Age of Onset   Arthritis Mother    COPD Father    Heart disease Father    Heart failure Father        died at age 93   Colon cancer Neg Hx    Colon polyps Neg Hx     Allergies as of 02/07/2023 - Review Complete 02/07/2023  Allergen Reaction Noted   Penicillins Anaphylaxis, Swelling, and Rash 03/19/2015    Social History   Socioeconomic History   Marital status: Single    Spouse name: Not on file   Number of children: Not on file   Years of education: Not on file   Highest education level: Not on file  Occupational History   Not on file  Tobacco Use   Smoking status: Never   Smokeless tobacco: Never  Vaping Use   Vaping Use: Never used  Substance and Sexual Activity   Alcohol use: Yes    Comment: drinks approx 3 times/month; beer, mixed drinks   Drug use: No   Sexual activity: Yes  Other Topics Concern   Not on file  Social History Narrative   Not on file   Social Determinants of Health   Financial Resource Strain: Low Risk  (12/11/2019)   Overall Financial Resource Strain (CARDIA)    Difficulty of Paying Living Expenses: Not hard at all  Food Insecurity: No Food Insecurity (12/11/2019)   Hunger Vital Sign    Worried About Running Out of Food in the Last Year: Never true    [place] in the Last Year: Never true  Transportation Needs: No Transportation Needs (12/11/2019)   PRAPARE - Hydrologist (Medical): No    Lack of Transportation (Non-Medical): No  Physical Activity: Sufficiently Active (12/11/2019)   Exercise Vital Sign    Days of Exercise per Week: 5 days     Minutes of Exercise per Session: 30 min  Stress: No Stress Concern Present (12/11/2019)   [place]    Feeling of Stress : Not at all  Social Connections: Socially Integrated (12/11/2019)   Social Connection and Isolation Panel [NHANES]    Frequency of Communication with Friends and Family: Three times a week    Frequency of Social Gatherings with Friends and Family: Once a week    Attends Religious Services: 1 to 4 times per year    Active Member of Clubs or  Organizations: Yes    Attends Music therapist: More than 4 times per year    Marital Status: Married     Review of Systems   Gen: Denies fever, chills, anorexia. Denies fatigue, weakness, weight loss.  CV: Denies chest pain, palpitations, syncope, peripheral edema, and claudication. Resp: Denies dyspnea at rest, cough, wheezing, coughing up blood, and pleurisy. GI: See HPI Derm: Denies rash, itching, dry skin Psych: Denies depression, anxiety, memory loss, confusion. No homicidal or suicidal ideation.  Heme: Denies bruising, bleeding, and enlarged lymph nodes.   Physical Exam   BP (!) 149/98 (BP Location: Left Arm, Patient Position: Sitting, Cuff Size: Large)   Pulse 64   Temp (!) 97.5 F (36.4 C) (Temporal)   Ht 6\' 3"  (1.905 m)   Wt 228 lb 3.2 oz (103.5 kg)   BMI 28.52 kg/m   General:   Alert and oriented. No distress noted. Pleasant and cooperative.  Head:  Normocephalic and atraumatic. Eyes:  Conjuctiva clear without scleral icterus. Mouth:  Oral mucosa pink and moist. Good dentition. No lesions. Lungs:  Clear to auscultation bilaterally. No wheezes, rales, or rhonchi. No distress.  Heart:  S1, S2 present without murmurs appreciated.  Abdomen:  +BS, soft, non-tender and non-distended. No rebound or guarding. No HSM or masses noted. Rectal: deferred Msk:  Symmetrical without gross deformities. Normal posture. Extremities:  Without  edema. Neurologic:  Alert and  oriented x4 Psych:  Alert and cooperative. Normal mood and affect.   Assessment  Dredyn Cristina is a 52 y.o. male with a history of chronic back pain, HTN, inguinal hernia, anxiety presenting today with complaint of constipation, bloating, and right upper quadrant pain.  Constipation, bloating, RLQ pain: Symptoms consistent with constipation.Recent lab work in February normal including normal TSH.  KUB in February revealed mild colonic stool burden and nonobstructive bowel gas pattern.  Normal right upper quadrant ultrasound.  Likely having intermittent bloating related to his constipation as well as possibly increasing fiber in his diet.  Occasionally has right lower quadrant abdominal pain that extends from his back which also could be related to constipation.  No alarm symptoms present.  Colonoscopy up-to-date from December 2022.  Has tried and failed MiraLAX twice daily.  Has tried using enemas without significant relief.  Typically has a bowel movement once daily but more consistently having incomplete emptying with urgency.  Will trial Linzess 145 mcg once daily.  Advised to wash out period.  If effective we will send in prescription.  PLAN   Continue prune juice nightly Start Linzess 145 mcg once daily.  Samples provided. Call the progress report in 1-2 weeks Ensure adequate water intake with 4-6 bottles of water daily. Will plan to check celiac labs if no improvement in bloating with adequate control of constipation Follow-up in 2 months, sooner if needed    Venetia Night, MSN, FNP-BC, AGACNP-BC [facility]

## 2023-02-07 ENCOUNTER — Encounter: Payer: Self-pay | Admitting: Gastroenterology

## 2023-02-07 ENCOUNTER — Ambulatory Visit (INDEPENDENT_AMBULATORY_CARE_PROVIDER_SITE_OTHER): Payer: BC Managed Care – PPO | Admitting: Gastroenterology

## 2023-02-07 VITALS — BP 149/98 | HR 64 | Temp 97.5°F | Ht 75.0 in | Wt 228.2 lb

## 2023-02-07 DIAGNOSIS — R1031 Right lower quadrant pain: Secondary | ICD-10-CM

## 2023-02-07 DIAGNOSIS — K5900 Constipation, unspecified: Secondary | ICD-10-CM

## 2023-02-07 DIAGNOSIS — R140 Abdominal distension (gaseous): Secondary | ICD-10-CM

## 2023-02-07 NOTE — Telephone Encounter (Signed)
This will be 1st fill with you

## 2023-02-07 NOTE — Telephone Encounter (Signed)
Spoke with pt, refill needs to go to Dr. Fredderick Phenix in [place]. Tried to route but provider was not in our EPIC system.   Called his office and they sent message informing provider.

## 2023-02-07 NOTE — Patient Instructions (Addendum)
Provide he was in samples of Linzess 145 mcg today.  You may take 1 tablet 30 minutes prior to eating.  How to take Linzess: Once a day every day on empty stomach, at least 30 minutes before your first meal of the day. It is best to keep medications at a stable temperature Medication is best kept in its original bottle with the disket present.  It is a medication that is meant for everyday use and not to be used as needed.   What to expect: Constipation relief is typically felt in about 1 week Relief of abdominal pain, discomfort, and bloating begins in about 1 week with symptoms typically improving over 12 weeks and beyond. Diarrhea is most common side effect and typically begins within the first 2 weeks and can take 3-4 weeks to resolve It would be helpful to begin treatment over the weekend or when you can be closer to a bathroom   You can go to [email or web address] for patient support and sign up for daily medication reminders.   You may continue using prune juice nightly.  I will see you in 2 months, sooner if needed.  Please not hesitate to reach out if you have any questions or concerns. Please remember to call me in 1-2 weeks to let me know how well this is working.  It was a pleasure to see you today. I want to create trusting relationships with patients. If you receive a survey regarding your visit,  I greatly appreciate you taking time to fill this out on paper or through your MyChart. I value your feedback.  Venetia Night, MSN, FNP-BC, AGACNP-BC [facility]

## 2023-03-06 ENCOUNTER — Other Ambulatory Visit: Payer: Self-pay | Admitting: Family Medicine

## 2023-03-06 NOTE — Telephone Encounter (Signed)
This will be his 1st fill with you?

## 2023-04-07 ENCOUNTER — Encounter: Payer: Self-pay | Admitting: Podiatry

## 2023-04-07 ENCOUNTER — Ambulatory Visit: Payer: BC Managed Care – PPO | Admitting: Podiatry

## 2023-04-07 DIAGNOSIS — M7751 Other enthesopathy of right foot: Secondary | ICD-10-CM

## 2023-04-07 DIAGNOSIS — M21612 Bunion of left foot: Secondary | ICD-10-CM

## 2023-04-07 DIAGNOSIS — M7752 Other enthesopathy of left foot: Secondary | ICD-10-CM

## 2023-04-07 DIAGNOSIS — M2012 Hallux valgus (acquired), left foot: Secondary | ICD-10-CM

## 2023-04-07 MED ORDER — TRIAMCINOLONE ACETONIDE 10 MG/ML IJ SUSP
10.0000 mg | Freq: Once | INTRAMUSCULAR | Status: AC
Start: 2023-04-07 — End: 2023-04-07
  Administered 2023-04-07: 10 mg

## 2023-04-07 NOTE — Progress Notes (Signed)
Subjective:   Patient ID: Aaron Page, male   DOB: 52 y.o.   MRN: [id number]   HPI Patient states he is still getting pain in the first metatarsal phalangeal joint that was treated about 5 months ago.  States that it has just become more symptomatic over the last month and he knows that he had a small fracture to the inside of the big toe with bunion deformity also noted   ROS      Objective:  Physical Exam  Neuro vas scaler status intact inflammation pain of the intermetatarsal first MPJ left and across the joint with moderate bunion and a spur on the inside of the joint where there was a probable fracture of the proximal phalanx     Assessment:  Inflammatory capsulitis of the first MPJ left with possible fracture of the bone and structural deformity     Plan:  H&P reviewed I did go ahead and I carefully injected the first MPJ lateral side 3 mg Dexasone Kenalog 5 mg Xylocaine and across the dorsal surface and I went ahead today and I casted for functional orthotics to try to reduce pressure against the joint and offload stress.  May require surgery long-term

## 2023-04-10 ENCOUNTER — Ambulatory Visit: Payer: BC Managed Care – PPO | Admitting: Gastroenterology

## 2023-04-18 ENCOUNTER — Ambulatory Visit: Payer: BC Managed Care – PPO | Admitting: Gastroenterology

## 2023-05-01 ENCOUNTER — Other Ambulatory Visit: Payer: BC Managed Care – PPO | Admitting: Podiatrist

## 2023-05-02 ENCOUNTER — Ambulatory Visit: Payer: BC Managed Care – PPO | Admitting: Gastroenterology

## 2023-05-19 ENCOUNTER — Ambulatory Visit: Payer: BC Managed Care – PPO | Admitting: Podiatry

## 2023-05-19 DIAGNOSIS — M7752 Other enthesopathy of left foot: Secondary | ICD-10-CM

## 2023-05-19 DIAGNOSIS — M2012 Hallux valgus (acquired), left foot: Secondary | ICD-10-CM

## 2023-05-19 DIAGNOSIS — M21612 Bunion of left foot: Secondary | ICD-10-CM

## 2023-05-19 NOTE — Progress Notes (Signed)
Patient presents today to pick up custom orthotics   Patient was dispensed 1 pair of custom orthotics  Fit was satisfactory. Instructions for break-in and wear was reviewed and a copy was given to the patient.    

## 2023-05-22 ENCOUNTER — Other Ambulatory Visit: Payer: Self-pay | Admitting: Family Medicine

## 2023-06-06 ENCOUNTER — Ambulatory Visit: Payer: BC Managed Care – PPO | Admitting: Family Medicine

## 2023-06-06 VITALS — BP 135/82 | HR 78 | Temp 98.9°F | Ht 75.0 in | Wt 222.0 lb

## 2023-06-06 DIAGNOSIS — H6123 Impacted cerumen, bilateral: Secondary | ICD-10-CM | POA: Diagnosis not present

## 2023-06-06 DIAGNOSIS — J069 Acute upper respiratory infection, unspecified: Secondary | ICD-10-CM | POA: Diagnosis not present

## 2023-06-06 LAB — POC COVID19 BINAXNOW: SARS Coronavirus 2 Ag: NEGATIVE

## 2023-06-06 MED ORDER — BENZONATATE 200 MG PO CAPS
200.0000 mg | ORAL_CAPSULE | Freq: Two times a day (BID) | ORAL | 0 refills | Status: DC | PRN
Start: 2023-06-06 — End: 2023-10-18

## 2023-06-06 NOTE — Patient Instructions (Addendum)
COVID negative, likely another virus. Your body will typically be able to fight off a virus within 8-10 days, if symptoms last longer than this or start to worsen, please let us know so we can evaluate and treat appropriately.  Continue supportive measures including rest, hydration, humidifier use, steam showers, warm compresses to sinuses, warm liquids with lemon and honey, and over-the-counter cough, cold, and analgesics as needed.  Please contact office for follow-up if symptoms do not improve or worsen. Seek emergency care if symptoms become severe. Adding Tessalon for cough. Continue guaifenesin, tylenol, ibuprofen, etc as needed.    The following information is provided as a Counsellor for ADULT patients only and does NOT take into account PREGNANCY, ALLERGIES, LIVER CONDITIONS, KIDNEY CONDITIONS, GASTROINTESTINAL CONDITIONS, OR PRESCRIPTION MEDICATION INTERACTIONS. Please be sure to ask your provider if the following are safe to take with your specific medical history, conditions, or current medication regimen if you are unsure.   Adult Basic Symptom Management   Congestion: Guaifenesin (Mucinex)- follow directions on packaging with a maximum dose of 2400mg  in a 24 hour period.  Pain/Fever: Ibuprofen 200mg  - 400mg  every 4-6 hours as needed (MAX 1200mg  in a 24 hour period) Pain/Fever: Tylenol 500mg  -1000mg  every 6-8 hours as needed (MAX 3000mg  in a 24 hour period)  Cough: Dextromethorphan (Delsym)- follow directions on packing with a maximum dose of 120mg  in a 24 hour period.  Nasal Stuffiness: Saline nasal spray and/or Nettie Pot with sterile saline solution  Runny Nose: Fluticasone nasal spray (Flonase) OR Mometasone nasal spray (Nasonex) OR Triamcinolone Acetonide nasal spray (Nasacort)- follow directions on the packaging  Pain/Pressure: Warm washcloth to the face  Sore Throat: Warm salt water gargles  If you have allergies, you may also consider taking an oral antihistamine  (like Zyrtec or Claritin) as these may also help with your symptoms.  **Many medications will have more than one ingredient, be sure you are reading the packaging carefully and not taking more than one dose of the same kind of medication at the same time or too close together. It is OK to use formulas that have all of the ingredients you want, but do not take them in a combined medication and as separate dose too close together. If you have any questions, the pharmacist will be happy to help you decide what is safe.

## 2023-06-06 NOTE — Addendum Note (Signed)
Addended bySilvio Pate on: 06/06/2023 10:44 AM   Modules accepted: Orders

## 2023-06-06 NOTE — Progress Notes (Signed)
Acute Office Visit  Subjective:     Patient ID: Aaron Page, male    DOB: 15-Sep-1971, 52 y.o.   MRN: [id number]  CC: URI symptoms   HPI Patient is in today for URI symptoms.   Discussed the use of AI scribe software for clinical note transcription with the patient, who gave verbal consent to proceed.  History of Present Illness   The patient presented with a three-day history of upper respiratory and systemic symptoms. The onset of symptoms was on Saturday evening, characterized by a feeling of general malaise. Despite the discomfort, the patient attended a social event and took a 'goodie powder' for symptom relief.  By Sunday, the symptoms had worsened. The patient reported taking another 'goodie powder', orange juice, and a banana before attending church. After returning home, the patient experienced an increase in symptom severity, prompting him to take Nyquil and naproxen. The patient also reported using Robitussin DM for symptom relief.  The patient experienced chills on Sunday night and Monday morning, along with a persistent cough. He reported a fever peaking at 101.13F on Sunday night, which decreased to 100.5F on Monday. The patient denied any additional Tylenol use.  The patient's symptoms included fever, chills, body aches, headache, and cough. He reported minimal sneezing and a slightly runny nose but denied any sore throat, nausea, vomiting, or diarrhea. The patient also reported pressure in the sinuses and a mostly dry cough.   The patient works with rubber and uses earplugs at work. He reported one ear being clogged and mentioned using peroxide to clean his ears during showers.          ROS All review of systems negative except what is listed in the HPI      Objective:    BP 135/82   Pulse 78   Temp 98.9 F (37.2 C) (Oral)   Ht 6\' 3"  (1.905 m)   Wt 222 lb (100.7 kg)   SpO2 97%   BMI 27.75 kg/m    Physical Exam Vitals reviewed.   Constitutional:      Appearance: Normal appearance.  HENT:     Head: Normocephalic and atraumatic.     Right Ear: There is impacted cerumen.     Left Ear: There is impacted cerumen.     Mouth/Throat:     Mouth: Mucous membranes are moist.     Pharynx: Oropharynx is clear. No oropharyngeal exudate or posterior oropharyngeal erythema.  Eyes:     Extraocular Movements: Extraocular movements intact.     Conjunctiva/sclera: Conjunctivae normal.  Cardiovascular:     Rate and Rhythm: Normal rate and regular rhythm.     Heart sounds: Normal heart sounds.  Pulmonary:     Effort: Pulmonary effort is normal.     Breath sounds: Normal breath sounds.  Musculoskeletal:     Cervical back: Normal range of motion and neck supple. No tenderness.  Lymphadenopathy:     Cervical: No cervical adenopathy.  Skin:    General: Skin is warm and dry.  Neurological:     Mental Status: He is alert and oriented to person, place, and time.  Psychiatric:        Mood and Affect: Mood normal.        Behavior: Behavior normal.        Thought Content: Thought content normal.        Judgment: Judgment normal.     No results found for any visits on 06/06/23.      Assessment &  Plan:   Problem List Items Addressed This Visit   None Visit Diagnoses     Bilateral impacted cerumen    -  Primary Irrigated. TMs normal. Tolerated well. See below.    Viral upper respiratory tract infection     COVID negative, likely another virus. Your body will typically be able to fight off a virus within 8-10 days, if symptoms last longer than this or start to worsen, please let us know so we can evaluate and treat appropriately.  Continue supportive measures including rest, hydration, humidifier use, steam showers, warm compresses to sinuses, warm liquids with lemon and honey, and over-the-counter cough, cold, and analgesics as needed.  Please contact office for follow-up if symptoms do not improve or worsen. Seek emergency  care if symptoms become severe. Adding Tessalon for cough. Continue guaifenesin, tylenol, ibuprofen, etc as needed.    Relevant Medications   benzonatate (TESSALON) 200 MG capsule     Indication: Cerumen impaction of the ear(s)  Medical necessity statement: On physical examination, cerumen impairs clinically significant portions of the external auditory canal, and tympanic membrane. Noted obstructive, copious cerumen that cannot be removed without magnification and instrumentations requiring professional removal.   Consent: Discussed benefits and risks of procedure and verbal consent obtained  Procedure: Patient was prepped for the procedure. Otoscope utilized to assess and take note of the ear canal, the tympanic membrane, and the presence, amount, and placement of the cerumen.  Gentle irrigation with water at body temperature utilized to remove impacted cerumen.  Excess water drained by gravity and ear canal(s) dried with clean guaze.  Post procedure examination: Otoscopic examination reveals complete cerumen removal with no damage to the auditory canal, tympanic membrane, or surrounding tissue.  Patient tolerated procedure well.   Post procedure instructions: Patient made aware that they may experience temporary vertigo, temporary changes in hearing, and temporary discomfort. If these symptom last for more than 24 hours to call the clinic or proceed to the ED for further evaluation. Discussed avoiding placing objects into the ear canal for cleaning.    Meds ordered this encounter  Medications   benzonatate (TESSALON) 200 MG capsule    Sig: Take 1 capsule (200 mg total) by mouth 2 (two) times daily as needed for cough.    Dispense:  20 capsule    Refill:  0    Order Specific Question:   Supervising Provider    Answer:   Danise Edge A [[id number]]    Return if symptoms worsen or fail to improve.  Clayborne Dana, NP

## 2023-06-20 ENCOUNTER — Ambulatory Visit: Payer: BC Managed Care – PPO | Admitting: Family Medicine

## 2023-07-07 ENCOUNTER — Ambulatory Visit: Payer: BC Managed Care – PPO | Admitting: Podiatry

## 2023-07-07 DIAGNOSIS — M21612 Bunion of left foot: Secondary | ICD-10-CM

## 2023-07-07 DIAGNOSIS — M19072 Primary osteoarthritis, left ankle and foot: Secondary | ICD-10-CM

## 2023-07-07 DIAGNOSIS — M205X2 Other deformities of toe(s) (acquired), left foot: Secondary | ICD-10-CM

## 2023-07-07 DIAGNOSIS — M2012 Hallux valgus (acquired), left foot: Secondary | ICD-10-CM

## 2023-07-07 NOTE — Progress Notes (Signed)
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 Patient is not able to have surgery at this moment due to personal reasons. He is requesting an injection if possible to help with his pain until he is ready to have surgery. He is currerntlty taking Gabapentin and Ibuprofen 800mg  for pain relief.     52 y.o. male presents with concern for pain in left great toe joint.  Patient reports that he had an injection with steroid and June of this year by Dr. Dellia Nims.  He reports there was improvement after steroid injection.  He is not able to have surgery at this time due to personal reasons.  He is also taking gabapentin and ibuprofen for spinal issues.  Past Medical History:  Diagnosis Date   GAD (generalized anxiety disorder) 02/15/2016   Hypertension    Inguinal hernia 03/25/2015   Sickle cell trait (HCC)     Allergies  Allergen Reactions   Penicillins Anaphylaxis, Swelling and Rash    ROS: Negative except as per HPI above  Objective:  General: AAO x3, NAD  Dermatological: With inspection and palpation of the right and left lower extremities there are no open sores, no preulcerative lesions, no rash or signs of infection present. Nails are of normal length thickness and coloration.   Vascular:  Dorsalis Pedis artery and Posterior Tibial artery pedal pulses are 2/4 bilateral.  Capillary fill time < 3 sec to all digits.   Neruologic: Grossly intact via light touch bilateral. Protective threshold intact to all sites bilateral.   Musculoskeletal: Pain of the patient on the left hallux first MPJ.  Osseous prominence noted about the dorsal aspect of the first metatarsal head.  Patient has mild to moderate hallux valgus deformity with prominent  medial eminence of the metatarsal head.  Gait: Unassisted, Nonantalgic.   No images are attached to the encounter.  Radiographs:  Date: November 07, 2022 XR the left foot Weightbearing AP/Lateral/Oblique   Findings: Hallux abductovalgus deformity noted with increased first and metatarsal angle.  Hallux valgus angle is increased.  Mild dorsiflexion of the first metatarsal.  Abnormality of joint space with narrowing and irregular contour of the first metatarsal phalangeal joint. Assessment:   1. Hallux limitus of left foot   2. Hallux valgus with bunions, left   3. Arthritis of first metatarsophalangeal (MTP) joint of left foot      Plan:  Patient was evaluated and treated and all questions answered.  # Hallux valgus and hallux limitus of the first metatarsal phalange joint -Discussed with patient he has 2 issues both a bunion and some arthritis of the first metatarsal phalange joint with dorsiflexed first metatarsal -Discussed surgical treatment options briefly however patient does not have a proceed with surgery at this time due to personal reasons -Therefore recommend steroid injection and was agreeable to this. -After sterile prep injected 1 cc half percent Marcaine plain with 1 cc Kenalog 10 into the lateral aspect of this metatarsophalangeal joint. -Recommend continued use of anti-inflammatory medications as well as PowerStep orthotics for custom orthotics as needed for pain control.  Return if symptoms worsen or fail to improve.          Corinna Gab, DPM [facility] /  [facility]

## 2023-10-01 ENCOUNTER — Other Ambulatory Visit: Payer: Self-pay | Admitting: Family Medicine

## 2023-10-02 MED ORDER — GABAPENTIN 300 MG PO CAPS: 300.0000 mg | ORAL_CAPSULE | Freq: Three times a day (TID) | ORAL | 1 refills | Status: AC

## 2023-10-02 MED ORDER — TRIAMCINOLONE ACETONIDE 0.1 % EX CREA: TOPICAL_CREAM | CUTANEOUS | 0 refills | Status: DC

## 2023-10-02 NOTE — Telephone Encounter (Signed)
1st fill w you

## 2023-10-18 ENCOUNTER — Ambulatory Visit (INDEPENDENT_AMBULATORY_CARE_PROVIDER_SITE_OTHER): Payer: BC Managed Care – PPO | Admitting: Family Medicine

## 2023-10-18 ENCOUNTER — Encounter: Payer: Self-pay | Admitting: Family Medicine

## 2023-10-18 VITALS — BP 124/84 | HR 69 | Temp 97.8°F | Ht 75.0 in | Wt 229.0 lb

## 2023-10-18 DIAGNOSIS — I10 Essential (primary) hypertension: Secondary | ICD-10-CM | POA: Diagnosis not present

## 2023-10-18 DIAGNOSIS — R350 Frequency of micturition: Secondary | ICD-10-CM | POA: Diagnosis not present

## 2023-10-18 DIAGNOSIS — Z0000 Encounter for general adult medical examination without abnormal findings: Secondary | ICD-10-CM

## 2023-10-18 DIAGNOSIS — Z113 Encounter for screening for infections with a predominantly sexual mode of transmission: Secondary | ICD-10-CM

## 2023-10-18 DIAGNOSIS — R351 Nocturia: Secondary | ICD-10-CM | POA: Diagnosis not present

## 2023-10-18 DIAGNOSIS — R1031 Right lower quadrant pain: Secondary | ICD-10-CM

## 2023-10-18 DIAGNOSIS — M5441 Lumbago with sciatica, right side: Secondary | ICD-10-CM

## 2023-10-18 DIAGNOSIS — G8929 Other chronic pain: Secondary | ICD-10-CM

## 2023-10-18 DIAGNOSIS — Z0001 Encounter for general adult medical examination with abnormal findings: Secondary | ICD-10-CM | POA: Insufficient documentation

## 2023-10-18 DIAGNOSIS — E559 Vitamin D deficiency, unspecified: Secondary | ICD-10-CM

## 2023-10-18 DIAGNOSIS — Z125 Encounter for screening for malignant neoplasm of prostate: Secondary | ICD-10-CM | POA: Diagnosis not present

## 2023-10-18 DIAGNOSIS — E7800 Pure hypercholesterolemia, unspecified: Secondary | ICD-10-CM | POA: Diagnosis not present

## 2023-10-18 LAB — COMPREHENSIVE METABOLIC PANEL
ALT: 15 U/L (ref 0–53)
AST: 17 U/L (ref 0–37)
Albumin: 4.1 g/dL (ref 3.5–5.2)
Alkaline Phosphatase: 44 U/L (ref 39–117)
BUN: 15 mg/dL (ref 6–23)
CO2: 32 meq/L (ref 19–32)
Calcium: 8.9 mg/dL (ref 8.4–10.5)
Chloride: 103 meq/L (ref 96–112)
Creatinine, Ser: 1.1 mg/dL (ref 0.40–1.50)
GFR: 76.93 mL/min (ref >60.00)
Glucose, Bld: 92 mg/dL (ref 70–99)
Potassium: 4.3 meq/L (ref 3.5–5.1)
Sodium: 142 meq/L (ref 135–145)
Total Bilirubin: 0.8 mg/dL (ref 0.2–1.2)
Total Protein: 6.4 g/dL (ref 6.0–8.3)

## 2023-10-18 LAB — POC URINALSYSI DIPSTICK (AUTOMATED)
Blood, UA: NEGATIVE
Glucose, UA: NEGATIVE
Leukocytes, UA: NEGATIVE
Nitrite, UA: NEGATIVE
Protein, UA: POSITIVE — AB
Spec Grav, UA: >=1.03 — AB (ref 1.010–1.025)
Urobilinogen, UA: 0.2 U/dL
pH, UA: 6 (ref 5.0–8.0)

## 2023-10-18 LAB — PSA: PSA: 1.36 ng/mL (ref 0.10–4.00)

## 2023-10-18 LAB — CBC WITH DIFFERENTIAL/PLATELET
Basophils Absolute: 0 10*3/uL (ref 0.0–0.1)
Basophils Relative: 0.2 % (ref 0.0–3.0)
Eosinophils Absolute: 0 10*3/uL (ref 0.0–0.7)
Eosinophils Relative: 0.3 % (ref 0.0–5.0)
HCT: 44 % (ref 39.0–52.0)
Hemoglobin: 14.3 g/dL (ref 13.0–17.0)
Lymphocytes Relative: 32.4 % (ref 12.0–46.0)
Lymphs Abs: 2.9 10*3/uL (ref 0.7–4.0)
MCHC: 32.4 g/dL (ref 30.0–36.0)
MCV: 81.9 fL (ref 78.0–100.0)
Monocytes Absolute: 0.5 10*3/uL (ref 0.1–1.0)
Monocytes Relative: 5.3 % (ref 3.0–12.0)
Neutro Abs: 5.5 10*3/uL (ref 1.4–7.7)
Neutrophils Relative %: 61.8 % (ref 43.0–77.0)
Platelets: 247 10*3/uL (ref 150.0–400.0)
RBC: 5.37 Mil/uL (ref 4.22–5.81)
RDW: 14.7 % (ref 11.5–15.5)
WBC: 8.9 10*3/uL (ref 4.0–10.5)

## 2023-10-18 LAB — LIPID PANEL
Cholesterol: 191 mg/dL (ref 0–200)
HDL: 69.1 mg/dL (ref >39.00)
LDL Cholesterol: 97 mg/dL (ref 0–99)
NonHDL: 121.4
Total CHOL/HDL Ratio: 3
Triglycerides: 121 mg/dL (ref 0.0–149.0)
VLDL: 24.2 mg/dL (ref 0.0–40.0)

## 2023-10-18 LAB — VITAMIN D 25 HYDROXY (VIT D DEFICIENCY, FRACTURES): VITD: 23.16 ng/mL — ABNORMAL LOW (ref 30.00–100.00)

## 2023-10-18 NOTE — Assessment & Plan Note (Signed)
Preventive health care reviewed.  Counseling on healthy lifestyle including diet and exercise.  Recommend regular dental and eye exams.  Immunizations reviewed.  Discussed safety.  

## 2023-10-18 NOTE — Assessment & Plan Note (Signed)
PSA ordered. Referral back to urology.

## 2023-10-18 NOTE — Progress Notes (Signed)
Complete physical exam  Patient: Aaron Page   DOB: 1971-07-13   52 y.o. Male  MRN: [id number]  Subjective:    Chief Complaint  Patient presents with   Annual Exam    Fasting  R abdominal pain radiating to back- dx before with L4 L5 disk but thinks something else is going on    He is here for a complete physical exam and to follow up on chronic health conditions.   States he went to an urgent care, [facility] in [place] for RLQ pain radiating to his right low back. They prescribed him a course of steroids and a muscle relaxant. Gave him a pain injection.  Taking ibuprofen and gabapentin. Pain has improved. Still has pain with certain movements.   Urinary frequency x 1-2 week. Nocturia, 3-4 times per night. He was seeing urology for prostate health but has not been to [facility] in more than 3 years.   Colonoscopy due again in 2027   Back specialist in Dr. Wess Botts in [place]  [facility] appt next week.   Goes to the [place] twice annually. Appt next week  Cervical spinal stenosis check up   Health Maintenance  Topic Date Due   Zoster (Shingles) Vaccine (1 of 2) Never done   Flu Shot  02/04/2029*   DTaP/Tdap/Td vaccine (2 - Td or Tdap) 02/08/2028   Colon Cancer Screening  11/06/2031   Hepatitis C Screening  Completed   HIV Screening  Completed   HPV Vaccine  Aged Out   COVID-19 Vaccine  Discontinued  *Topic was postponed. The date shown is not the original due date.    Wears seatbelt always, smoke detectors in home and functioning, does not text while driving, feels safe in home environment.  Depression screening:    01/04/2023    9:34 AM 08/23/2022   10:22 AM 06/22/2022   10:17 AM  Depression screen PHQ 2/9  Decreased Interest 0 1 0  Down, Depressed, Hopeless 0 0 0  PHQ - 2 Score 0 1 0  Altered sleeping   1  Tired, decreased energy   1  Change in appetite   1  Feeling bad or failure about yourself    0  Trouble concentrating   0  Moving slowly or  fidgety/restless   0  Suicidal thoughts   0  PHQ-9 Score   3  Difficult doing work/chores   Somewhat difficult   Anxiety Screening:    12/14/2020    3:18 PM  GAD 7 : Generalized Anxiety Score  Nervous, Anxious, on Edge 0  Control/stop worrying 0  Worry too much - different things 0  Trouble relaxing 0  Restless 0  Easily annoyed or irritable 1  Afraid - awful might happen 0  Total GAD 7 Score 1  Anxiety Difficulty Not difficult at all    Vision:Within last year, Dental: No current dental problems and Receives regular dental care, STD: The patient denies history of sexually transmitted disease., and PSA: Agrees to PSA testing  Patient Active Problem List   Diagnosis Date Noted   Encounter for general adult medical examination with abnormal findings 10/18/2023   Acute left-sided low back pain without sciatica 01/04/2023   Mass of skin of back 01/04/2023   Abdominal bloating 01/04/2023   Right upper quadrant abdominal tenderness with rebound tenderness 01/04/2023   Urinary frequency 08/23/2022   Acid reflux 06/22/2022   Routine general medical examination at a health care facility 06/22/2022   Vitamin D deficiency  06/22/2022   Fatigue 06/22/2022   Screening for prostate cancer 06/22/2022   Screen for STD (sexually transmitted disease) 06/22/2022   Elevated LDL cholesterol level 06/22/2022   Constipation 09/27/2021   Encounter for screening colonoscopy 09/27/2021   BMI 29.0-29.9,adult 12/14/2020   OAB (overactive bladder) 12/11/2019   Generalized anxiety disorder 02/15/2016   Chronic back pain 02/15/2016   Eczema 10/16/2015   Essential hypertension 03/25/2015   Past Medical History:  Diagnosis Date   Allergy 1987   GAD (generalized anxiety disorder) 02/15/2016   Hypertension    Inguinal hernia 03/25/2015   Sickle cell trait (HCC)    Past Surgical History:  Procedure Laterality Date   COLONOSCOPY WITH PROPOFOL N/A 11/05/2021   Procedure: COLONOSCOPY WITH PROPOFOL;   Surgeon: Lanelle Bal, DO;  Location: [facility];  Service: Endoscopy;  Laterality: N/A;  1:30pm   NO PAST SURGERIES     POLYPECTOMY  11/05/2021   Procedure: POLYPECTOMY;  Surgeon: Lanelle Bal, DO;  Location: [facility];  Service: Endoscopy;;   Social History   Tobacco Use   Smoking status: Never   Smokeless tobacco: Never  Vaping Use   Vaping status: Never Used  Substance Use Topics   Alcohol use: Yes    Alcohol/week: 6.0 standard drinks of alcohol    Types: 2 Glasses of wine, 2 Cans of beer, 2 Standard drinks or equivalent per week    Comment: drinks approx 3 times/month; beer, mixed drinks   Drug use: No      Patient Care Team: Avanell Shackleton, NP-C as PCP - General (Family Medicine) Lanelle Bal, DO as Consulting Physician (Internal Medicine)   Outpatient Medications Prior to Visit  Medication Sig   acetaminophen (TYLENOL) 325 MG tablet Take 650 mg by mouth every 6 (six) hours as needed.   gabapentin (NEURONTIN) 300 MG capsule Take 1 capsule (300 mg total) by mouth 3 (three) times daily.   ibuprofen (ADVIL) 800 MG tablet Take 800 mg by mouth every 8 (eight) hours as needed for moderate pain.   methocarbamol (ROBAXIN) 500 MG tablet SMARTSIG:1.0 Tablet(s) By Mouth Twice Daily   Multiple Vitamin (MULTIVITAMIN WITH MINERALS) TABS tablet Take 1 tablet by mouth daily.   Omega 3 1000 MG CAPS Take 1 capsule (1,000 mg total) by mouth 2 (two) times daily. (Patient taking differently: Take 1,000 mg by mouth daily.)   OVER THE COUNTER MEDICATION Vit D once per day.   triamcinolone cream (KENALOG) 0.1 % APPLY TO AFFECTED AREA TWICE A DAY   [DISCONTINUED] benzonatate (TESSALON) 200 MG capsule Take 1 capsule (200 mg total) by mouth 2 (two) times daily as needed for cough.   No facility-administered medications prior to visit.    Review of Systems  Constitutional:  Negative for chills, fever, malaise/fatigue and weight loss.  HENT:  Negative for congestion, ear  pain, sinus pain and sore throat.   Eyes:  Negative for blurred vision, double vision and pain.  Respiratory:  Negative for cough, shortness of breath and wheezing.   Cardiovascular:  Negative for chest pain, palpitations and leg swelling.  Gastrointestinal:  Positive for abdominal pain. Negative for constipation, diarrhea, nausea and vomiting.  Genitourinary:  Positive for frequency. Negative for dysuria and urgency.  Musculoskeletal:  Positive for back pain. Negative for joint pain and myalgias.  Skin:  Negative for rash.  Neurological:  Negative for dizziness, tingling, focal weakness and headaches.  Psychiatric/Behavioral:  Negative for depression. The patient is not nervous/anxious.  Objective:    BP 124/84 (BP Location: Left Arm, Patient Position: Sitting, Cuff Size: Large)   Pulse 69   Temp 97.8 F (36.6 C) (Temporal)   Ht 6\' 3"  (1.905 m)   Wt 229 lb (103.9 kg)   SpO2 96%   BMI 28.62 kg/m  BP Readings from Last 3 Encounters:  10/18/23 124/84  06/06/23 135/82  02/07/23 (!) 149/98   Wt Readings from Last 3 Encounters:  10/18/23 229 lb (103.9 kg)  06/06/23 222 lb (100.7 kg)  02/07/23 228 lb 3.2 oz (103.5 kg)    Physical Exam Constitutional:      General: He is not in acute distress.    Appearance: He is not ill-appearing.  HENT:     Right Ear: Tympanic membrane, ear canal and external ear normal.     Left Ear: Tympanic membrane, ear canal and external ear normal.     Nose: Nose normal.     Mouth/Throat:     Mouth: Mucous membranes are moist.     Pharynx: Oropharynx is clear.  Eyes:     Extraocular Movements: Extraocular movements intact.     Conjunctiva/sclera: Conjunctivae normal.  Cardiovascular:     Rate and Rhythm: Normal rate and regular rhythm.  Pulmonary:     Effort: Pulmonary effort is normal.     Breath sounds: Normal breath sounds.  Abdominal:     General: Bowel sounds are normal. There is no distension.     Palpations: Abdomen is soft.      Tenderness: There is no abdominal tenderness. There is no guarding or rebound.  Musculoskeletal:        General: Normal range of motion.     Cervical back: Normal range of motion and neck supple. No tenderness.     Right lower leg: No edema.     Left lower leg: No edema.  Lymphadenopathy:     Cervical: No cervical adenopathy.  Skin:    General: Skin is warm and dry.  Neurological:     General: No focal deficit present.     Mental Status: He is alert and oriented to person, place, and time.     Motor: No weakness.     Coordination: Coordination normal.     Gait: Gait normal.  Psychiatric:        Mood and Affect: Mood normal.        Behavior: Behavior normal.        Thought Content: Thought content normal.      Results for orders placed or performed in visit on 10/18/23  Lipid panel  Result Value Ref Range   Cholesterol 191 0 - 200 mg/dL   Triglycerides 295.2 0.0 - 149.0 mg/dL   HDL 84.13 >24.40 mg/dL   VLDL 10.2 0.0 - 72.5 mg/dL   LDL Cholesterol 97 0 - 99 mg/dL   Total CHOL/HDL Ratio 3    NonHDL 121.40   PSA  Result Value Ref Range   PSA 1.36 0.10 - 4.00 ng/mL  VITAMIN D 25 Hydroxy (Vit-D Deficiency, Fractures)  Result Value Ref Range   VITD 23.16 (L) 30.00 - 100.00 ng/mL  Comprehensive metabolic panel  Result Value Ref Range   Sodium 142 135 - 145 mEq/L   Potassium 4.3 3.5 - 5.1 mEq/L   Chloride 103 96 - 112 mEq/L   CO2 32 19 - 32 mEq/L   Glucose, Bld 92 70 - 99 mg/dL   BUN 15 6 - 23 mg/dL   Creatinine, Ser 3.66 0.40 -  1.50 mg/dL   Total Bilirubin 0.8 0.2 - 1.2 mg/dL   Alkaline Phosphatase 44 39 - 117 U/L   AST 17 0 - 37 U/L   ALT 15 0 - 53 U/L   Total Protein 6.4 6.0 - 8.3 g/dL   Albumin 4.1 3.5 - 5.2 g/dL   GFR 65.78 >46.96 mL/min   Calcium 8.9 8.4 - 10.5 mg/dL  CBC with Differential/Platelet  Result Value Ref Range   WBC 8.9 4.0 - 10.5 K/uL   RBC 5.37 4.22 - 5.81 Mil/uL   Hemoglobin 14.3 13.0 - 17.0 g/dL   HCT 29.5 28.4 - 13.2 %   MCV 81.9 78.0 -  100.0 fl   MCHC 32.4 30.0 - 36.0 g/dL   RDW 44.0 10.2 - 72.5 %   Platelets 247.0 150.0 - 400.0 K/uL   Neutrophils Relative % 61.8 43.0 - 77.0 %   Lymphocytes Relative 32.4 12.0 - 46.0 %   Monocytes Relative 5.3 3.0 - 12.0 %   Eosinophils Relative 0.3 0.0 - 5.0 %   Basophils Relative 0.2 0.0 - 3.0 %   Neutro Abs 5.5 1.4 - 7.7 K/uL   Lymphs Abs 2.9 0.7 - 4.0 K/uL   Monocytes Absolute 0.5 0.1 - 1.0 K/uL   Eosinophils Absolute 0.0 0.0 - 0.7 K/uL   Basophils Absolute 0.0 0.0 - 0.1 K/uL  POCT Urinalysis Dipstick (Automated)  Result Value Ref Range   Color, UA yellow    Clarity, UA clear    Glucose, UA Negative Negative   Bilirubin, UA 1+    Ketones, UA trace    Spec Grav, UA >=1.030 (A) 1.010 - 1.025   Blood, UA negative    pH, UA 6.0 5.0 - 8.0   Protein, UA Positive (A) Negative   Urobilinogen, UA 0.2 0.2 or 1.0 E.U./dL   Nitrite, UA negative    Leukocytes, UA Negative Negative      Assessment & Plan:    Routine Health Maintenance and Physical Exam  Problem List Items Addressed This Visit     Chronic back pain    Chronic.  Follows with Orthopedic Surgeon in [place], [place].  Upcoming appt with [facility].       Relevant Medications   methocarbamol (ROBAXIN) 500 MG tablet   Elevated LDL cholesterol level    Check fasting lipids       Relevant Orders   Lipid panel (Completed)   Encounter for general adult medical examination with abnormal findings - Primary    Preventive health care reviewed.  Counseling on healthy lifestyle including diet and exercise.  Recommend regular dental and eye exams.  Immunizations reviewed.  Discussed safety.       Essential hypertension    Controlled. Continue with healthy diet. Check renal function       Relevant Orders   CBC with Differential/Platelet (Completed)   Comprehensive metabolic panel (Completed)   Screen for STD (sexually transmitted disease)    asymptomatic      Relevant Orders   Hepatitis C antibody   Hepatitis B  surface antigen   GC/Chlamydia Probe Amp   HIV Antibody (routine testing w rflx)   RPR   Screening for prostate cancer    PSA ordered. Referral back to urology.       Relevant Orders   PSA (Completed)   Urinary frequency    UA negative for infection. Referral to urology       Relevant Orders   Ambulatory referral to Urology   POCT Urinalysis  Dipstick (Automated) (Completed)   Vitamin D deficiency    Check vitamin D level and replace as needed.       Relevant Orders   VITAMIN D 25 Hydroxy (Vit-D Deficiency, Fractures) (Completed)   Other Visit Diagnoses     Nocturia more than twice per night       Relevant Orders   Ambulatory referral to Urology   RLQ abdominal pain           Return in about 6 months (around 04/17/2024) for chronic health conditions.     Hetty Blend, NP-C

## 2023-10-18 NOTE — Assessment & Plan Note (Signed)
UA negative for infection. Referral to urology

## 2023-10-18 NOTE — Assessment & Plan Note (Signed)
asymptomatic

## 2023-10-18 NOTE — Assessment & Plan Note (Addendum)
Chronic.  Follows with Orthopedic Surgeon in [place], [place].  Upcoming appt with [facility].

## 2023-10-18 NOTE — Assessment & Plan Note (Signed)
Check vitamin D level and replace as needed.  

## 2023-10-18 NOTE — Assessment & Plan Note (Signed)
Controlled. Continue with healthy diet. Check renal function

## 2023-10-18 NOTE — Patient Instructions (Addendum)
Check with your insurance regarding the Shingrix vaccines. This is a 2 shot series.   We will be in touch with your results.   Continue to follow up with specialists as recommended.   You should hear from [facility].

## 2023-10-18 NOTE — Assessment & Plan Note (Signed)
Check fasting lipids 

## 2023-10-19 ENCOUNTER — Encounter: Payer: Self-pay | Admitting: Family Medicine

## 2023-10-19 LAB — HEPATITIS C ANTIBODY: Hepatitis C Ab: NONREACTIVE

## 2023-10-19 LAB — RPR: RPR Ser Ql: NONREACTIVE

## 2023-10-19 LAB — HIV ANTIBODY (ROUTINE TESTING W REFLEX): HIV 1&2 Ab, 4th Generation: NONREACTIVE

## 2023-10-19 LAB — HEPATITIS B SURFACE ANTIGEN: Hepatitis B Surface Ag: NONREACTIVE

## 2023-10-22 ENCOUNTER — Other Ambulatory Visit: Payer: Self-pay | Admitting: Family Medicine

## 2023-10-22 DIAGNOSIS — E559 Vitamin D deficiency, unspecified: Secondary | ICD-10-CM

## 2023-10-22 LAB — GC/CHLAMYDIA PROBE AMP
Chlamydia trachomatis, NAA: NEGATIVE
Neisseria Gonorrhoeae by PCR: NEGATIVE

## 2023-10-22 MED ORDER — VITAMIN D (ERGOCALCIFEROL) 1.25 MG (50000 UNIT) PO CAPS: 50000.0000 [IU] | ORAL_CAPSULE | ORAL | 0 refills | Status: DC

## 2023-10-26 ENCOUNTER — Encounter: Payer: Self-pay | Admitting: Podiatry

## 2023-10-26 ENCOUNTER — Encounter: Payer: Self-pay | Admitting: Family Medicine

## 2023-10-26 ENCOUNTER — Ambulatory Visit: Payer: BC Managed Care – PPO | Admitting: Podiatry

## 2023-10-26 DIAGNOSIS — M21612 Bunion of left foot: Secondary | ICD-10-CM

## 2023-10-26 DIAGNOSIS — M5416 Radiculopathy, lumbar region: Secondary | ICD-10-CM | POA: Insufficient documentation

## 2023-10-26 DIAGNOSIS — M2012 Hallux valgus (acquired), left foot: Secondary | ICD-10-CM

## 2023-10-26 DIAGNOSIS — M205X2 Other deformities of toe(s) (acquired), left foot: Secondary | ICD-10-CM | POA: Diagnosis not present

## 2023-10-26 NOTE — Progress Notes (Signed)
Subjective:  Patient ID: Aaron Page, male    DOB: Aug 16, 1971,  MRN: [id number]  Chief Complaint  Patient presents with   Foot Pain    Left foot, he has had 2 prior injections and would like to discuss surgery.      52 y.o. male presents for follow-up of left foot pain.  Previously given steroid injection for first MPJ pain due to hallux valgus and hallux rigidus.  Patient states he is still having a lot of pain even with short distances walking with any motion of the joint especially when the toe is flexed down.  Hoping to discuss surgery at this time as he does not feel that conservative options are working and is taking a lot of anti-inflammatory medications without improvement.  Past Medical History:  Diagnosis Date   Allergy 1987   GAD (generalized anxiety disorder) 02/15/2016   Hypertension    Inguinal hernia 03/25/2015   Sickle cell trait (HCC)     Allergies  Allergen Reactions   Penicillins Anaphylaxis, Swelling and Rash    ROS: Negative except as per HPI above  Objective:  General: AAO x3, NAD  Dermatological: With inspection and palpation of the right and left lower extremities there are no open sores, no preulcerative lesions, no rash or signs of infection present. Nails are of normal length thickness and coloration.   Vascular:  Dorsalis Pedis artery and Posterior Tibial artery pedal pulses are 2/4 bilateral.  Capillary fill time < 3 sec to all digits.   Neruologic: Grossly intact via light touch bilateral. Protective threshold intact to all sites bilateral.   Musculoskeletal: Pain of the patient on the left hallux first MPJ.  Osseous prominence noted about the dorsal aspect of the first metatarsal head.  Patient has mild to moderate hallux valgus deformity with prominent medial eminence of the metatarsal head.  Gait: Unassisted, Nonantalgic.   No images are attached to the encounter.  Radiographs:  Date: November 07, 2022 XR the left foot Weightbearing  AP/Lateral/Oblique   Findings: Hallux abductovalgus deformity noted with increased first and metatarsal angle.  Hallux valgus angle is increased.  Mild dorsiflexion of the first metatarsal.  Abnormality of joint space with narrowing and irregular contour of the first metatarsal phalangeal joint.  Concern for possible prior fracture fragment at the lateral base of the first proximal phalanx.  Assessment:   1. Hallux limitus of left foot   2. Hallux valgus with bunions, left       Plan:  Patient was evaluated and treated and all questions answered.  # Hallux valgus and hallux limitus of the first metatarsal phalange joint -Discussed with patient conservative and surgical treatment options once again.  He reports he is now needing to proceed with surgery as he is still having a lot of pain. -Discussed surgical options including bunion surgery versus first MPJ arthrodesis versus cheilectomy -Discussed with Dr. Lilian Kapur as well as the patient and I believe the best option surgically would be for first MPJ arthrodesis given his activity level and age.  -Patient is agreeable to proceed.  Discussed risk benefits alternatives and possible complications associated with first MPJ arthrodesis as well as the expected recovery course including need for 4 weeks nonweightbearing.  Informed surgical site was obtained we will begin surgical planning.  -In the meantime we will perform a steroid injection for short-term relief prior to surgery -After sterile prep injected 1 cc half percent Marcaine plain with 1 cc Kenalog 10 into the lateral aspect of this  metatarsophalangeal joint. -Recommend continued use of anti-inflammatory medications as well as PowerStep orthotics or custom orthotics as needed for pain control.          Corinna Gab, DPM [facility] / [facility]

## 2023-10-26 NOTE — Telephone Encounter (Signed)
Looks like he just had physical on the 11th, you still want him to come in for pre surg clearance?

## 2023-10-27 ENCOUNTER — Encounter: Payer: Self-pay | Admitting: Podiatry

## 2023-11-13 ENCOUNTER — Telehealth: Payer: Self-pay | Admitting: Podiatry

## 2023-11-13 NOTE — Telephone Encounter (Signed)
 Pt called and would like to reschedule his 11/29/23 surgery to a later date, preferably towards the end of February. The pt is scheduled at [facility] on 11/29/23.

## 2023-11-15 ENCOUNTER — Other Ambulatory Visit: Payer: Self-pay | Admitting: Family Medicine

## 2023-11-15 DIAGNOSIS — E559 Vitamin D deficiency, unspecified: Secondary | ICD-10-CM

## 2023-11-17 ENCOUNTER — Ambulatory Visit: Payer: BC Managed Care – PPO | Admitting: Family Medicine

## 2023-11-21 ENCOUNTER — Inpatient Hospital Stay: Admission: RE | Admit: 2023-11-21 | Payer: BC Managed Care – PPO | Source: Ambulatory Visit

## 2023-12-01 ENCOUNTER — Telehealth: Payer: Self-pay | Admitting: Podiatry

## 2023-12-01 NOTE — Telephone Encounter (Signed)
DOS-01/03/24  HALLUX MPJ FUSION LT- 40981  BCBS EFFECTIVE DATE- 11/07/20  DEDUCTIBLE- $150.00 WITH REMAINING $150.00 OOP-$1000.00 WITH REMAINING $1000.00 COINSURANCE- 0%  SPOKE WITH [facility] T FORM [employer] AND HE STATED THAT PRIOR AUTH IS NOT REQUIRED FOR CPT CODE 19147.  CALL REF #Charline Bills - [id number]

## 2023-12-05 ENCOUNTER — Encounter: Payer: BC Managed Care – PPO | Admitting: Podiatry

## 2023-12-09 DIAGNOSIS — M19072 Primary osteoarthritis, left ankle and foot: Secondary | ICD-10-CM

## 2023-12-09 HISTORY — DX: Primary osteoarthritis, left ankle and foot: M19.072

## 2023-12-26 ENCOUNTER — Encounter
Admission: RE | Admit: 2023-12-26 | Discharge: 2023-12-26 | Disposition: A | Discharge status: Home / Self Care | Payer: BC Managed Care – PPO | Source: Ambulatory Visit | Attending: Podiatry | Admitting: Podiatry

## 2023-12-26 ENCOUNTER — Encounter: Payer: Self-pay | Admitting: Family Medicine

## 2023-12-26 ENCOUNTER — Other Ambulatory Visit: Payer: BC Managed Care – PPO

## 2023-12-26 ENCOUNTER — Ambulatory Visit: Payer: BC Managed Care – PPO | Admitting: Family Medicine

## 2023-12-26 ENCOUNTER — Other Ambulatory Visit: Payer: Self-pay

## 2023-12-26 VITALS — Ht 75.0 in | Wt 225.0 lb

## 2023-12-26 VITALS — BP 122/84 | HR 85 | Temp 97.6°F | Ht 75.0 in | Wt 229.0 lb

## 2023-12-26 DIAGNOSIS — Z01818 Encounter for other preprocedural examination: Secondary | ICD-10-CM

## 2023-12-26 DIAGNOSIS — R9431 Abnormal electrocardiogram [ECG] [EKG]: Secondary | ICD-10-CM | POA: Diagnosis not present

## 2023-12-26 DIAGNOSIS — Z01812 Encounter for preprocedural laboratory examination: Secondary | ICD-10-CM

## 2023-12-26 DIAGNOSIS — I10 Essential (primary) hypertension: Secondary | ICD-10-CM

## 2023-12-26 DIAGNOSIS — E559 Vitamin D deficiency, unspecified: Secondary | ICD-10-CM

## 2023-12-26 DIAGNOSIS — Z01810 Encounter for preprocedural cardiovascular examination: Secondary | ICD-10-CM

## 2023-12-26 HISTORY — DX: Obstructive sleep apnea (adult) (pediatric): G47.33

## 2023-12-26 HISTORY — DX: Overactive bladder: N32.81

## 2023-12-26 HISTORY — DX: Essential (primary) hypertension: I10

## 2023-12-26 HISTORY — DX: Vitamin D deficiency, unspecified: E55.9

## 2023-12-26 HISTORY — DX: Dermatitis, unspecified: L30.9

## 2023-12-26 NOTE — Patient Instructions (Signed)
Your procedure is scheduled on: Wednesday, February 26 Report to the Registration Desk on the 1st floor of the [facility] To find out your arrival time, please call [phone] between 1PM - 3PM on: Tuesday, February 25 If your arrival time is 6:00 am, do not arrive before that time as the [facility] entrance doors do not open until 6:00 am.  REMEMBER: Instructions that are not followed completely may result in serious medical risk, up to and including death; or upon the discretion of your surgeon and anesthesiologist your surgery may need to be rescheduled.  Do not eat or drink after midnight the night before surgery.  No gum chewing or hard candies.  One week prior to surgery: starting February 19 Stop Anti-inflammatories (NSAIDS) such as Advil, Aleve, Ibuprofen, Motrin, Naproxen, Naprosyn and Aspirin based products such as Excedrin, Goody's Powder, BC Powder. Stop ANY OVER THE COUNTER supplements until after surgery. Stop vitamin D, multiple vitamins, omega 3 fish oil.  You may however, continue to take Tylenol if needed for pain up until the day of surgery.  Continue taking all of your other prescription medications up until the day of surgery.  ON THE DAY OF SURGERY ONLY TAKE THESE MEDICATIONS WITH SIPS OF WATER:  Gabapentin if needed for pain Vibegron (GEMTESA)   No Alcohol for 24 hours before or after surgery.  No Smoking including e-cigarettes for 24 hours before surgery.  No chewable tobacco products for at least 6 hours before surgery.  No nicotine patches on the day of surgery.  Do not use any "recreational" drugs for at least a week (preferably 2 weeks) before your surgery.  Please be advised that the combination of cocaine and anesthesia may have negative outcomes, up to and including death. If you test positive for cocaine, your surgery will be cancelled.  On the morning of surgery brush your teeth with toothpaste and water, you may rinse your mouth with  mouthwash if you wish. Do not swallow any toothpaste or mouthwash.  Use CHG Soap as directed on instruction sheet.  Do not wear jewelry, make-up, hairpins, clips or nail polish.  For welded (permanent) jewelry: bracelets, anklets, waist bands, etc.  Please have this removed prior to surgery.  If it is not removed, there is a chance that hospital personnel will need to cut it off on the day of surgery.  Do not wear lotions, powders, or perfumes.   Do not shave body hair from the neck down 48 hours before surgery.  Contact lenses, hearing aids and dentures may not be worn into surgery.  Do not bring valuables to the hospital. [facility] is not responsible for any missing/lost belongings or valuables.   Bring your C-PAP to the hospital in case you may have to spend the night.   Notify your doctor if there is any change in your medical condition (cold, fever, infection).  Wear comfortable clothing (specific to your surgery type) to the hospital.  After surgery, you can help prevent lung complications by doing breathing exercises.  Take deep breaths and cough every 1-2 hours.   If you are being discharged the day of surgery, you will not be allowed to drive home. You will need a responsible individual to drive you home and stay with you for 24 hours after surgery.   If you are taking public transportation, you will need to have a responsible individual with you.  Please call the Pre-admissions Testing Dept. at [phone] if you have any questions  about these instructions.  Surgery Visitation Policy:  Patients having surgery or a procedure may have two visitors.  Children under the age of 47 must have an adult with them who is not the patient.  Temporary Visitor Restrictions Due to increasing cases of flu, RSV and COVID-19: Children ages 71 and under will not be able to visit patients in [facility] hospitals under most circumstances.      Preparing for Surgery with  CHLORHEXIDINE GLUCONATE (CHG) Soap  Chlorhexidine Gluconate (CHG) Soap  o An antiseptic cleaner that kills germs and bonds with the skin to continue killing germs even after washing  o Used for showering the night before surgery and morning of surgery  Before surgery, you can play an important role by reducing the number of germs on your skin.  CHG (Chlorhexidine gluconate) soap is an antiseptic cleanser which kills germs and bonds with the skin to continue killing germs even after washing.  Please do not use if you have an allergy to CHG or antibacterial soaps. If your skin becomes reddened/irritated stop using the CHG.  1. Shower the NIGHT BEFORE SURGERY and the MORNING OF SURGERY with CHG soap.  2. If you choose to wash your hair, wash your hair first as usual with your normal shampoo.  3. After shampooing, rinse your hair and body thoroughly to remove the shampoo.  4. Use CHG as you would any other liquid soap. You can apply CHG directly to the skin and wash gently with a scrungie or a clean washcloth.  5. Apply the CHG soap to your body only from the neck down. Do not use on open wounds or open sores. Avoid contact with your eyes, ears, mouth, and genitals (private parts). Wash face and genitals (private parts) with your normal soap.  6. Wash thoroughly, paying special attention to the area where your surgery will be performed.  7. Thoroughly rinse your body with warm water.  8. Do not shower/wash with your normal soap after using and rinsing off the CHG soap.  9. Pat yourself dry with a clean towel.  10. Wear clean pajamas to bed the night before surgery.  12. Place clean sheets on your bed the night of your first shower and do not sleep with pets.  13. Shower again with the CHG soap on the day of surgery prior to arriving at the hospital.  14. Do not apply any deodorants/lotions/powders.  15. Please wear clean clothes to the hospital.

## 2023-12-26 NOTE — Progress Notes (Unsigned)
Subjective:     Patient ID: Aaron Page, male    DOB: 1971/01/29, 53 y.o.   MRN: [id number]  Chief Complaint  Patient presents with   Pre-op Exam    HPI   History of Present Illness         Here for surgical clearance.   Having surgery on 01/03/2024 for left foot surgery by Dr. Annamary Rummage   CPE done on 10/18/2023  Dr. Jacquelyne Balint prescribed Cena Benton for OAB  No issues with anesthesia      Health Maintenance Due  Topic Date Due   Zoster Vaccines- Shingrix (1 of 2) Never done    Past Medical History:  Diagnosis Date   Allergy 1987   Arthritis of first metatarsophalangeal (MTP) joint of left foot 12/2023   Eczema    Essential hypertension    GAD (generalized anxiety disorder) 02/15/2016   Inguinal hernia 03/25/2015   Obstructive sleep apnea on CPAP    Overactive bladder    Sickle cell trait (HCC)    Vitamin D deficiency     Past Surgical History:  Procedure Laterality Date   COLONOSCOPY WITH PROPOFOL N/A 11/05/2021   Procedure: COLONOSCOPY WITH PROPOFOL;  Surgeon: Lanelle Bal, DO;  Location: [facility];  Service: Endoscopy;  Laterality: N/A;  1:30pm   NO PAST SURGERIES     POLYPECTOMY  11/05/2021   Procedure: POLYPECTOMY;  Surgeon: Lanelle Bal, DO;  Location: [facility];  Service: Endoscopy;;    Family History  Problem Relation Age of Onset   Arthritis Mother    COPD Father    Heart disease Father    Heart failure Father        died at age 64   Colon cancer Neg Hx    Colon polyps Neg Hx     Social History   Socioeconomic History   Marital status: Single    Spouse name: Not on file   Number of children: 3   Years of education: Not on file   Highest education level: Bachelor's degree (e.g., BA, AB, BS)  Occupational History   Not on file  Tobacco Use   Smoking status: Never   Smokeless tobacco: Never  Vaping Use   Vaping status: Never Used  Substance and Sexual Activity   Alcohol use: Yes    Alcohol/week: 6.0  standard drinks of alcohol    Types: 2 Glasses of wine, 2 Cans of beer, 2 Standard drinks or equivalent per week    Comment: drinks approx 4 times/month; beer, mixed drinks   Drug use: Not Currently    Types: Marijuana   Sexual activity: Yes    Birth control/protection: Condom  Other Topics Concern   Not on file  Social History Narrative   Lives alone   Social Drivers of Health   Financial Resource Strain: Low Risk  (10/15/2023)   Overall Financial Resource Strain (CARDIA)    Difficulty of Paying Living Expenses: Not hard at all  Food Insecurity: No Food Insecurity (10/15/2023)   Hunger Vital Sign    Worried About Running Out of Food in the Last Year: Never true    Ran Out of Food in the Last Year: Never true  Transportation Needs: No Transportation Needs (10/15/2023)   PRAPARE - Administrator, Civil Service (Medical): No    Lack of Transportation (Non-Medical): No  Physical Activity: Insufficiently Active (10/15/2023)   Exercise Vital Sign    Days of Exercise per Week: 3 days  Minutes of Exercise per Session: 10 min  Stress: No Stress Concern Present (10/15/2023)   [employer] of Occupational Health - Occupational Stress Questionnaire    Feeling of Stress : Not at all  Social Connections: Moderately Integrated (10/15/2023)   Social Connection and Isolation Panel [NHANES]    Frequency of Communication with Friends and Family: More than three times a week    Frequency of Social Gatherings with Friends and Family: Once a week    Attends Religious Services: More than 4 times per year    Active Member of [employer] or Organizations: Yes    Attends Banker Meetings: More than 4 times per year    Marital Status: Never married  Intimate Partner Violence: Unknown (02/08/2022)   Received from [employer], [facility]   HITS    Physically Hurt: Not on file    Insult or Talk Down To: Not on file    Threaten Physical Harm: Not on file    Scream or Curse: Not  on file    Outpatient Medications Prior to Visit  Medication Sig Dispense Refill   dextromethorphan-guaiFENesin (MUCINEX DM) 30-600 MG 12hr tablet Take 1 tablet by mouth daily.     gabapentin (NEURONTIN) 300 MG capsule Take 1 capsule (300 mg total) by mouth 3 (three) times daily. (Patient taking differently: Take 300 mg by mouth 3 (three) times daily as needed (pain).) 90 capsule 1   ibuprofen (ADVIL) 800 MG tablet Take 800 mg by mouth every 8 (eight) hours as needed for moderate pain.     Multiple Vitamin (MULTIVITAMIN WITH MINERALS) TABS tablet Take 1 tablet by mouth daily. 90 tablet 1   triamcinolone cream (KENALOG) 0.1 % APPLY TO AFFECTED AREA TWICE A DAY (Patient taking differently: Apply 1 Application topically daily as needed (irritation).) 30 g 0   Vibegron (GEMTESA) 75 MG TABS Take 75 mg by mouth daily.     acetaminophen (TYLENOL) 325 MG tablet Take 650 mg by mouth every 6 (six) hours as needed for moderate pain (pain score 4-6).     No facility-administered medications prior to visit.    Allergies  Allergen Reactions   Penicillins Anaphylaxis, Swelling and Rash    Review of Systems  Constitutional:  Negative for chills, fever, malaise/fatigue and weight loss.  HENT:  Positive for congestion. Negative for ear pain, sinus pain and sore throat.   Eyes:  Negative for blurred vision and double vision.  Respiratory:  Negative for cough and shortness of breath.   Cardiovascular:  Negative for chest pain, palpitations and leg swelling.  Gastrointestinal:  Negative for abdominal pain, constipation, diarrhea, nausea and vomiting.  Genitourinary:  Negative for dysuria, frequency and urgency.  Musculoskeletal:  Positive for joint pain.  Skin:  Negative for rash.  Neurological:  Negative for dizziness, weakness and headaches.  Psychiatric/Behavioral:  Negative for depression and substance abuse. The patient is not nervous/anxious.        Objective:    Physical Exam Constitutional:       General: He is not in acute distress.    Appearance: He is not ill-appearing.  HENT:     Right Ear: Tympanic membrane, ear canal and external ear normal.     Left Ear: Tympanic membrane, ear canal and external ear normal.     Nose: Congestion present.     Mouth/Throat:     Mouth: Mucous membranes are moist.     Pharynx: Oropharynx is clear.  Eyes:     Extraocular Movements:  Extraocular movements intact.     Conjunctiva/sclera: Conjunctivae normal.     Pupils: Pupils are equal, round, and reactive to light.  Neck:     Thyroid: No thyroid mass, thyromegaly or thyroid tenderness.  Cardiovascular:     Rate and Rhythm: Normal rate and regular rhythm.     Pulses: Normal pulses.     Heart sounds: Normal heart sounds.  Pulmonary:     Effort: Pulmonary effort is normal.     Breath sounds: Normal breath sounds.  Abdominal:     General: There is no distension.     Palpations: Abdomen is soft.  Musculoskeletal:        General: Normal range of motion.     Cervical back: Normal range of motion and neck supple.     Right lower leg: No edema.     Left lower leg: No edema.  Skin:    General: Skin is warm and dry.  Neurological:     General: No focal deficit present.     Mental Status: He is alert and oriented to person, place, and time.     Cranial Nerves: No cranial nerve deficit.     Sensory: No sensory deficit.     Motor: No weakness.  Psychiatric:        Mood and Affect: Mood normal.        Behavior: Behavior normal.        Thought Content: Thought content normal.      BP 122/84 (BP Location: Left Arm, Patient Position: Sitting)   Pulse 85   Temp 97.6 F (36.4 C) (Temporal)   Ht 6\' 3"  (1.905 m)   Wt 229 lb (103.9 kg)   SpO2 98%   BMI 28.62 kg/m  Wt Readings from Last 3 Encounters:  12/26/23 229 lb (103.9 kg)  12/26/23 225 lb (102.1 kg)  10/18/23 229 lb (103.9 kg)       Assessment & Plan:   Problem List Items Addressed This Visit     Essential hypertension    Vitamin D deficiency   Other Visit Diagnoses       Preoperative clearance    -  Primary   Relevant Orders   EKG 12-Lead     Abnormal EKG       Relevant Orders   ECHOCARDIOGRAM COMPLETE      Reviewed labs from CPE 10/17/2024 EKG shows NSR, rate 75, T wave inversion lead III    I have discontinued Daeshaun Pacer's acetaminophen and cholecalciferol. I am also having him maintain his ibuprofen, multivitamin with minerals, gabapentin, triamcinolone cream, Gemtesa, and dextromethorphan-guaiFENesin.  No orders of the defined types were placed in this encounter.

## 2023-12-28 ENCOUNTER — Telehealth: Payer: Self-pay | Admitting: Family Medicine

## 2023-12-28 NOTE — Telephone Encounter (Signed)
This was faxed 2/18 but we have resent fax again today to number provided on form. Confirmation fax received.

## 2023-12-28 NOTE — Telephone Encounter (Signed)
Document faxed Surgical Clearance, to be filled out by provider. Patient requested to send it back via Fax within ASAP. Document is located in providers tray at front office.Please advise at Mobile [phone] (mobile)

## 2023-12-29 ENCOUNTER — Ambulatory Visit: Payer: BC Managed Care – PPO | Admitting: Family Medicine

## 2024-01-01 ENCOUNTER — Ambulatory Visit (HOSPITAL_COMMUNITY): Payer: BC Managed Care – PPO | Attending: Family Medicine

## 2024-01-01 ENCOUNTER — Telehealth: Payer: Self-pay | Admitting: Family Medicine

## 2024-01-01 DIAGNOSIS — R9431 Abnormal electrocardiogram [ECG] [EKG]: Secondary | ICD-10-CM | POA: Insufficient documentation

## 2024-01-01 LAB — ECHOCARDIOGRAM COMPLETE
Area-P 1/2: 3.56 cm2
S' Lateral: 3.1 cm

## 2024-01-01 NOTE — Telephone Encounter (Signed)
 Copied from CRM [id number]. Topic: General - Other >> Jan 01, 2024  3:59 PM Jon Gills C wrote: Reason for CRM: Patient called in regarding his echo results, and if he may needs surgery he would like for someone to give him a callback on what he needs to do , so he can get things together for his job. Is requesting a message back via mychart

## 2024-01-01 NOTE — Telephone Encounter (Signed)
 Results in media tab, please advise

## 2024-01-02 ENCOUNTER — Encounter: Payer: Self-pay | Admitting: Family Medicine

## 2024-01-02 MED ORDER — CHLORHEXIDINE GLUCONATE 0.12 % MT SOLN
15.0000 mL | Freq: Once | OROMUCOSAL | Status: AC
  Administered 2024-01-03: 15 mL via OROMUCOSAL

## 2024-01-02 MED ORDER — ORAL CARE MOUTH RINSE: 15.0000 mL | Freq: Once | OROMUCOSAL | Status: AC

## 2024-01-02 MED ORDER — CLINDAMYCIN PHOSPHATE 900 MG/50ML IV SOLN
900.0000 mg | Freq: Once | INTRAVENOUS | Status: AC
Start: 2024-01-03 — End: 2024-01-03
  Administered 2024-01-03: 900 mg via INTRAVENOUS

## 2024-01-02 MED ORDER — LACTATED RINGERS IV SOLN: INTRAVENOUS | Status: DC

## 2024-01-03 ENCOUNTER — Other Ambulatory Visit: Payer: Self-pay

## 2024-01-03 ENCOUNTER — Ambulatory Visit: Payer: BC Managed Care – PPO

## 2024-01-03 ENCOUNTER — Encounter
Admission: RE | Disposition: A | Discharge status: Home / Self Care | Payer: Self-pay | Source: Ambulatory Visit | Attending: Podiatry

## 2024-01-03 ENCOUNTER — Ambulatory Visit
Admission: RE | Admit: 2024-01-03 | Discharge: 2024-01-03 | Disposition: A | Discharge status: Home / Self Care | Payer: BC Managed Care – PPO | Source: Ambulatory Visit | Attending: Podiatry | Admitting: Podiatry

## 2024-01-03 ENCOUNTER — Encounter: Payer: Self-pay | Admitting: Podiatry

## 2024-01-03 ENCOUNTER — Ambulatory Visit: Payer: Self-pay

## 2024-01-03 ENCOUNTER — Ambulatory Visit: Payer: BC Managed Care – PPO | Admitting: Urgent Care

## 2024-01-03 DIAGNOSIS — G4730 Sleep apnea, unspecified: Secondary | ICD-10-CM | POA: Insufficient documentation

## 2024-01-03 DIAGNOSIS — I10 Essential (primary) hypertension: Secondary | ICD-10-CM | POA: Diagnosis not present

## 2024-01-03 DIAGNOSIS — M21612 Bunion of left foot: Secondary | ICD-10-CM | POA: Diagnosis not present

## 2024-01-03 DIAGNOSIS — M2020 Hallux rigidus, unspecified foot: Secondary | ICD-10-CM | POA: Insufficient documentation

## 2024-01-03 DIAGNOSIS — M2022 Hallux rigidus, left foot: Secondary | ICD-10-CM | POA: Diagnosis not present

## 2024-01-03 HISTORY — PX: ARTHRODESIS METATARSALPHALANGEAL JOINT (MTPJ): SHX6566

## 2024-01-03 SURGERY — FUSION, JOINT, GREAT TOE
Anesthesia: General | Site: Toe | Laterality: Left

## 2024-01-03 MED ORDER — OXYCODONE HCL 5 MG PO TABS: 5.0000 mg | ORAL_TABLET | Freq: Once | ORAL | Status: DC | PRN

## 2024-01-03 MED ORDER — PROPOFOL 10 MG/ML IV BOLUS
INTRAVENOUS | Status: DC | PRN
  Administered 2024-01-03: 200 mg via INTRAVENOUS

## 2024-01-03 MED ORDER — PHENYLEPHRINE HCL-NACL 20-0.9 MG/250ML-% IV SOLN
INTRAVENOUS | Status: AC
  Filled 2024-01-03: qty 250

## 2024-01-03 MED ORDER — IBUPROFEN 800 MG PO TABS: 800.0000 mg | ORAL_TABLET | Freq: Three times a day (TID) | ORAL | 1 refills | Status: DC | PRN

## 2024-01-03 MED ORDER — FENTANYL CITRATE (PF) 100 MCG/2ML IJ SOLN
INTRAMUSCULAR | Status: DC | PRN
  Administered 2024-01-03: 50 ug via INTRAVENOUS

## 2024-01-03 MED ORDER — MIDAZOLAM HCL 2 MG/2ML IJ SOLN
INTRAMUSCULAR | Status: AC
  Filled 2024-01-03: qty 2

## 2024-01-03 MED ORDER — PROPOFOL 10 MG/ML IV BOLUS
INTRAVENOUS | Status: AC
  Filled 2024-01-03: qty 40

## 2024-01-03 MED ORDER — DEXAMETHASONE SODIUM PHOSPHATE 10 MG/ML IJ SOLN
INTRAMUSCULAR | Status: AC
  Filled 2024-01-03: qty 1

## 2024-01-03 MED ORDER — MIDAZOLAM HCL 2 MG/2ML IJ SOLN
INTRAMUSCULAR | Status: DC | PRN
  Administered 2024-01-03: 2 mg via INTRAVENOUS

## 2024-01-03 MED ORDER — FENTANYL CITRATE (PF) 100 MCG/2ML IJ SOLN
INTRAMUSCULAR | Status: AC
Start: 2024-01-03 — End: ?
  Filled 2024-01-03: qty 2

## 2024-01-03 MED ORDER — LIDOCAINE HCL (CARDIAC) PF 100 MG/5ML IV SOSY
PREFILLED_SYRINGE | INTRAVENOUS | Status: DC | PRN
  Administered 2024-01-03: 100 mg via INTRAVENOUS

## 2024-01-03 MED ORDER — ONDANSETRON HCL 4 MG/2ML IJ SOLN
INTRAMUSCULAR | Status: DC | PRN
Start: 2024-01-03 — End: 2024-01-03
  Administered 2024-01-03: 4 mg via INTRAVENOUS

## 2024-01-03 MED ORDER — LIDOCAINE HCL (PF) 2 % IJ SOLN
INTRAMUSCULAR | Status: AC
  Filled 2024-01-03: qty 5

## 2024-01-03 MED ORDER — CLINDAMYCIN PHOSPHATE 900 MG/50ML IV SOLN
INTRAVENOUS | Status: AC
  Filled 2024-01-03: qty 50

## 2024-01-03 MED ORDER — 0.9 % SODIUM CHLORIDE (POUR BTL) OPTIME
TOPICAL | Status: DC | PRN
  Administered 2024-01-03: 500 mL

## 2024-01-03 MED ORDER — BUPIVACAINE HCL (PF) 0.5 % IJ SOLN
INTRAMUSCULAR | Status: DC | PRN
  Administered 2024-01-03: 20 mL

## 2024-01-03 MED ORDER — CHLORHEXIDINE GLUCONATE 0.12 % MT SOLN
OROMUCOSAL | Status: AC
  Filled 2024-01-03: qty 15

## 2024-01-03 MED ORDER — EPHEDRINE SULFATE-NACL 50-0.9 MG/10ML-% IV SOSY
PREFILLED_SYRINGE | INTRAVENOUS | Status: DC | PRN
  Administered 2024-01-03: 5 mg via INTRAVENOUS

## 2024-01-03 MED ORDER — FENTANYL CITRATE (PF) 100 MCG/2ML IJ SOLN: 25.0000 ug | INTRAMUSCULAR | Status: DC | PRN

## 2024-01-03 MED ORDER — ONDANSETRON HCL 4 MG/2ML IJ SOLN
INTRAMUSCULAR | Status: AC
Start: 2024-01-03 — End: ?
  Filled 2024-01-03: qty 2

## 2024-01-03 MED ORDER — OXYCODONE-ACETAMINOPHEN 5-325 MG PO TABS: 1.0000 | ORAL_TABLET | ORAL | 0 refills | Status: DC | PRN

## 2024-01-03 MED ORDER — CLINDAMYCIN HCL 300 MG PO CAPS: 300.0000 mg | ORAL_CAPSULE | Freq: Three times a day (TID) | ORAL | 0 refills | Status: AC

## 2024-01-03 MED ORDER — ONDANSETRON HCL 4 MG/2ML IJ SOLN
INTRAMUSCULAR | Status: AC
  Filled 2024-01-03: qty 2

## 2024-01-03 MED ORDER — BUPIVACAINE HCL (PF) 0.5 % IJ SOLN
INTRAMUSCULAR | Status: AC
Start: 2024-01-03 — End: ?
  Filled 2024-01-03: qty 30

## 2024-01-03 MED ORDER — DEXAMETHASONE SODIUM PHOSPHATE 10 MG/ML IJ SOLN
INTRAMUSCULAR | Status: DC | PRN
  Administered 2024-01-03: 10 mg via INTRAVENOUS

## 2024-01-03 MED ORDER — OXYCODONE HCL 5 MG/5ML PO SOLN: 5.0000 mg | Freq: Once | ORAL | Status: DC | PRN

## 2024-01-03 MED ORDER — PHENYLEPHRINE 80 MCG/ML (10ML) SYRINGE FOR IV PUSH (FOR BLOOD PRESSURE SUPPORT)
PREFILLED_SYRINGE | INTRAVENOUS | Status: DC | PRN
  Administered 2024-01-03: 160 ug via INTRAVENOUS
  Administered 2024-01-03: 80 ug via INTRAVENOUS
  Administered 2024-01-03: 160 ug via INTRAVENOUS
  Administered 2024-01-03: 80 ug via INTRAVENOUS

## 2024-01-03 SURGICAL SUPPLY — 57 items
BENZOIN TINCTURE PRP APPL 2/3 (GAUZE/BANDAGES/DRESSINGS) IMPLANT
BIT DRILL CANN F/COMP 2.2 (BIT) IMPLANT
BLADE SURG 15 STRL LF DISP TIS (BLADE) ×2 IMPLANT
BLADE SW THK.38XMED LNG THN (BLADE) ×1 IMPLANT
BNDG ELASTIC 4INX 5YD STR LF (GAUZE/BANDAGES/DRESSINGS) ×2 IMPLANT
BNDG ESMARCH 4X12 STRL LF (GAUZE/BANDAGES/DRESSINGS) ×1 IMPLANT
BNDG GAUZE DERMACEA FLUFF 4 (GAUZE/BANDAGES/DRESSINGS) ×1 IMPLANT
BOOT STEPPER DURA LG (SOFTGOODS) IMPLANT
CHLORAPREP W/TINT 26 (MISCELLANEOUS) ×1 IMPLANT
COVER BACK TABLE 60X90IN (DRAPES) ×1 IMPLANT
CUFF TOURN SGL QUICK 12 (TOURNIQUET CUFF) IMPLANT
CUFF TOURN SGL QUICK 18X4 (TOURNIQUET CUFF) IMPLANT
DRAPE IMP U-DRAPE 54X76 (DRAPES) ×1 IMPLANT
DRAPE U-SHAPE 47X51 STRL (DRAPES) ×1 IMPLANT
DRSG ADAPTIC 3X8 NADH LF (GAUZE/BANDAGES/DRESSINGS) IMPLANT
ELECT REM PT RETURN 9FT ADLT (ELECTROSURGICAL) ×1 IMPLANT
ELECTRODE REM PT RTRN 9FT ADLT (ELECTROSURGICAL) ×1 IMPLANT
GAUZE SPONGE 4X4 12PLY STRL (GAUZE/BANDAGES/DRESSINGS) ×1 IMPLANT
GAUZE XEROFORM 1X8 LF (GAUZE/BANDAGES/DRESSINGS) ×1 IMPLANT
GLOVE BIOGEL PI IND STRL 7.5 (GLOVE) ×1 IMPLANT
GLOVE SURG SYN 7.5 E (GLOVE) ×1 IMPLANT
GLOVE SURG SYN 7.5 PF PI (GLOVE) ×1 IMPLANT
GOWN STRL REUS W/ TWL LRG LVL3 (GOWN DISPOSABLE) ×2 IMPLANT
GUIDEWIRE ORTH 157X1.6XTROC (WIRE) IMPLANT
K-WIRE FX6X.062X2 END TROC (WIRE) IMPLANT
KIT TURNOVER CYSTO (KITS) ×1 IMPLANT
KWIRE FX6X.062X2 END TROC (WIRE) IMPLANT
MANIFOLD NEPTUNE II (INSTRUMENTS) ×1 IMPLANT
NDL FILTER BLUNT 18X1 1/2 (NEEDLE) ×1 IMPLANT
NDL HYPO 25X1 1.5 SAFETY (NEEDLE) ×3 IMPLANT
NEEDLE FILTER BLUNT 18X1 1/2 (NEEDLE) ×1 IMPLANT
NEEDLE HYPO 25X1 1.5 SAFETY (NEEDLE) ×3 IMPLANT
NS IRRIG 500ML POUR BTL (IV SOLUTION) ×1 IMPLANT
PACK EXTREMITY ARMC (MISCELLANEOUS) ×1 IMPLANT
PAD CAST 4YDX4 CTTN HI CHSV (CAST SUPPLIES) ×1 IMPLANT
PAD PREP 24X41 OB/GYN DISP (PERSONAL CARE ITEMS) ×1 IMPLANT
PAD PREP OB/GYN DISP 24X41 (PERSONAL CARE ITEMS) ×1 IMPLANT
PENCIL SMOKE EVACUATOR (MISCELLANEOUS) ×1 IMPLANT
PIN BB TAK MTP SU (PIN) IMPLANT
PLATE COMPR MTP LT 5 (Plate) IMPLANT
RASP SM TEAR CROSS CUT (RASP) IMPLANT
REAMER METACARPAL 22 (BIT) IMPLANT
REAMER METATARSAL CUP 20MM (MISCELLANEOUS) IMPLANT
REAMER PHALANGEAL 22MM (MISCELLANEOUS) IMPLANT
SCREW CORTICAL 3.0X20 (Screw) IMPLANT
SCREW LOCK COMP 3X16 (Screw) IMPLANT
SCREW VAL KREULOCK 3.0X18 TI (Screw) IMPLANT
STOCKINETTE 48X4 2 PLY STRL (GAUZE/BANDAGES/DRESSINGS) ×1 IMPLANT
STOCKINETTE STRL 4IN 9604848 (GAUZE/BANDAGES/DRESSINGS) ×1 IMPLANT
STRIP CLOSURE SKIN 1/4X4 (GAUZE/BANDAGES/DRESSINGS) IMPLANT
SUCTION TUBE FRAZIER 10FR DISP (SUCTIONS) ×1 IMPLANT
SUT MNCRL AB 3-0 PS2 27 (SUTURE) IMPLANT
SUT MNCRL AB 4-0 PS2 18 (SUTURE) IMPLANT
SUT PROLENE 4 0 PS 2 18 (SUTURE) IMPLANT
SYR 10ML LL (SYRINGE) ×2 IMPLANT
TUBING CONNECTING 10 (TUBING) ×1 IMPLANT
WATER STERILE IRR 500ML POUR (IV SOLUTION) ×1 IMPLANT

## 2024-01-03 NOTE — Discharge Instructions (Signed)
 Foot & Ankle Surgery Patient Discharge Instructions:   Arrange to have an adult drive you home after surgery. If you had general anesthesia, it may take a day or more to fully recover. So, for at least the next 24 hours: Do not drive or use machinery or power tools; do not drink alcohol; and do not make any major decisions.   Bandages: Keep your dressings clean, dry, and intact. Do not remove or change. When bathing/showering, cover the bandage or cast with a plastic bag to keep it dry (still keep the area away from the water) and use a chair, do not stand. Don't remove your bandage until your doctor tells you to. If your bandage gets wet or dirty, check with your doctor. You can likely replace it with a clean, dry one.  Activity: Non weightbearing to left foot Sit or lie down when possible. Put a pillow under your heel to raise your foot above the level of your heart. Place ice bag or pack behind knee on surgical side for 20 minutes every hour that you are awake for the first 24-48 hours. You can drive again when instructed by your doctor. Wear your surgical shoe at all times unless told otherwise by your health care provider. Use crutches or a cane as directed. Follow your doctor's instructions about putting weight on your foot.  Diet: Start with liquids and light foods (such as dry toast, bananas, and applesauce). As you feel up to it, slowly return to your normal diet. Drink at least six to eight glasses of water or other nonalcoholic fluids a day. To avoid nausea, eat before taking narcotic pain medications.  Medications: Take all medications as instructed. Take pain medications on time. Do not wait until the pain is bad before taking your medications. Avoid alcohol while on pain medications.  Call your doctor if: Continuous bleeding through dressings. If your dressings get wet. Fevers over 100.4 degrees Fahrenheit (38 degrees Celsius). Chest pains or difficulty breathing.

## 2024-01-03 NOTE — Op Note (Addendum)
 Full Operative Report  Date of Operation: 9:10 AM, 01/03/2024   Patient: Aaron Page - 53 y.o. 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 First metatarsal phalangeal joint arthrodesis, left foot    Anesthesia: General  No responsible provider has been recorded for the case.  Anesthesiologist: Stephanie Coup, MD CRNA: Hezzie Bump, CRNA; Morene Crocker, CRNA Student Nurse Anesthetist: Pricilla Handler, RN   Estimated Blood Loss: Minimal   Hemostasis: 1) Anatomical dissection, mechanical compression, electrocautery 2) Ankle tourniquet 250 mm hg 54 min  Implants: Implant Name Type Inv. Item Serial No. Manufacturer Lot No. LRB No. Used Action  SCREW LOCK COMP 3X16 - [id number] Screw SCREW LOCK COMP 3X16  [employer]  Left 4 Implanted  SCREW CORTICAL 3.0X20 - [id number] Screw SCREW CORTICAL 3.0X20  [employer]  Left 1 Implanted  SCREW VAL KREULOCK 3.0X18 TI - [id number] Screw SCREW VAL KREULOCK 3.0X18 TI  [employer]  Left 1 Implanted  MTP [employer]force  Plate 5 hole left Plate   [employer]  Left 1 Implanted    Materials: 3-0 and 4-0 monocryl  Injectables: 1) Pre-operatively: 20 cc of   0.5% marcaine plain 2) Post-operatively: None   Specimens: Pathology: NA  Microbiology: NA   Antibiotics: Clindamycin 900 mg given pre op  Drains: None  Complications: Patient tolerated the procedure well without complication.   Operative findings: As below in detailed report  Indications for Procedure: Aaron Page presents to Pilar Plate, DPM with a chief complaint of chornic left 1st MPJ pain worse with ROM concern for hallux rigidus as well as bunion deformity. The patient has failed conservative treatments of various modalities. At this time the patient has elected to proceed with surgical correction. All alternatives, risks, and complications of the procedures  were thoroughly explained to the patient. Patient exhibits appropriate understanding of all discussion points and informed consent was signed and obtained in the chart with no guarantees to surgical outcome given or implied.  Description of Procedure: Patient was brought to the operating room. Patient placed on OR table in Supine posiiton. A surgical timeout was performed and all members of the operating room, the procedure, and the surgical site were identified. anesthesia occurred as per anesthesia record. Local anesthetic as previously described was then injected about the operative field in a local infiltrative block.  The operative lower extremity as noted above was then prepped and draped in the usual sterile manner. The following procedure then began.  Attention was then directed on the dorsomedial aspect of the first metatarsophalangeal joint on the LEFT foot. A linear incision using a 15 blade was placed directly over the first MPJ parallel and medial to the course of the extensor hallucis longus tendon.The incision was deepened through subcutaneous tissues. All the bleeders were identified, cut, clamped, and cauterized. The incision was deepened to the level of the capsule and the periosteum of the joint. All neurovascular structures were identified and retracted from the site. Using sharp and dull dissection, the periosteal and capsular attachments were mobilized from the head of the metatarsal. Upon inspection of the first MPJ, circumferential external osteophytic lipping of the metatarsal head and the phalangeal base were noted.  Next, a rongeur and  power rasp was used to resect the prominent medial, dorsal, and lateral eminences of the first metatarsal head, and a rongeur to remove all osteophytic lipping around the proximal phalangeal base.  The metatarsal head demonstrated approximately 50%  cartilaginous degeneration. It was determined that the joint was non-salvageable. Next, using the  standard manufacturer's technique, a reaming system to create a cup and cone joint  The first metatarsal head was reamed down to subchondral bone with a cone reamer, and the proximal phalangeal base was also reamed to subchondral bone with a cup reamer.  Next, subchondral bone was drilled in order to promote arthrodesis across the osteotomy site. The joint was held in an anatomical position and provisionally fixated with a K-wire. Next, the hardware previously described was placed under strict AO technique to fixate the MPJ. Fixation was found to be excellent both clinically and under fluoroscopy. The hallux was noted to be in anatomical alignment. Provisional fixation was removed. All remaining bony edges were smoothed. The area was copiously flushed with saline. Then using the suture materials previously described, the site was closed in anatomic layers and the skin was well approximated under minimal tension.  The surgical site was then dressed with xerofrom, 4x4 kerlix and ace wrap. The patient tolerated both the procedure and anesthesia well with vital signs stable throughout. The patient was transferred in good condition and all vital signs stable  from the OR to recovery under the discretion of anesthesia.  Condition: Vital signs stable, neurovascular status unchanged from preoperative   Surgical plan:  Solid 1st MPJ arthrodesis. He was noted to have significant medial and lateral spurring with loose bodies.   The patient will be NWB in a CAM boot to the operative limb until further instructed. The dressing is to remain clean, dry, and intact. Will continue to follow unless noted elsewhere.   Carlena Hurl, DPM [facility]

## 2024-01-03 NOTE — Anesthesia Preprocedure Evaluation (Signed)
 Anesthesia Evaluation  Patient identified by MRN, date of birth, ID band Patient awake    Reviewed: Allergy & Precautions, NPO status , Patient's Chart, lab work & pertinent test results  Airway Mallampati: II  TM Distance: >3 FB Neck ROM: full    Dental  (+) Dental Advidsory Given, Teeth Intact   Pulmonary sleep apnea and Continuous Positive Airway Pressure Ventilation    Pulmonary exam normal        Cardiovascular hypertension, negative cardio ROS Normal cardiovascular exam     Neuro/Psych  PSYCHIATRIC DISORDERS Anxiety     negative neurological ROS     GI/Hepatic negative GI ROS, Neg liver ROS,,,  Endo/Other  negative endocrine ROS    Renal/GU      Musculoskeletal   Abdominal   Peds  Hematology negative hematology ROS (+)   Anesthesia Other Findings Past Medical History: 1987: Allergy 12/2023: Arthritis of first metatarsophalangeal (MTP) joint of left  foot No date: Eczema No date: Essential hypertension 02/15/2016: GAD (generalized anxiety disorder) 03/25/2015: Inguinal hernia No date: Obstructive sleep apnea on CPAP No date: Overactive bladder No date: Sickle cell trait (HCC) No date: Vitamin D deficiency  Past Surgical History: 11/05/2021: COLONOSCOPY WITH PROPOFOL; N/A     Comment:  Procedure: COLONOSCOPY WITH PROPOFOL;  Surgeon: Lanelle Bal, DO;  Location: [facility];  Service:               Endoscopy;  Laterality: N/A;  1:30pm No date: NO PAST SURGERIES 11/05/2021: POLYPECTOMY     Comment:  Procedure: POLYPECTOMY;  Surgeon: Lanelle Bal, DO;              Location: [facility];  Service: Endoscopy;;  BMI    Body Mass Index: 28.12 kg/m      Reproductive/Obstetrics negative OB ROS                             Anesthesia Physical Anesthesia Plan  ASA: 2  Anesthesia Plan: General   Post-op Pain Management:    Induction:  Intravenous  PONV Risk Score and Plan: 2 and Ondansetron, Dexamethasone and Midazolam  Airway Management Planned: LMA  Additional Equipment:   Intra-op Plan:   Post-operative Plan: Extubation in OR  Informed Consent: I have reviewed the patients History and Physical, chart, labs and discussed the procedure including the risks, benefits and alternatives for the proposed anesthesia with the patient or authorized representative who has indicated his/her understanding and acceptance.     Dental Advisory Given  Plan Discussed with: Anesthesiologist, CRNA and Surgeon  Anesthesia Plan Comments: (Patient consented for risks of anesthesia including but not limited to:  - adverse reactions to medications - damage to eyes, teeth, lips or other oral mucosa - nerve damage due to positioning  - sore throat or hoarseness - Damage to heart, brain, nerves, lungs, other parts of body or loss of life  Patient voiced understanding and assent.)       Anesthesia Quick Evaluation

## 2024-01-03 NOTE — Anesthesia Procedure Notes (Signed)
 Procedure Name: LMA Insertion Date/Time: 01/03/2024 7:33 AM  Performed by: Morene Crocker, CRNAPre-anesthesia Checklist: Patient identified, Patient being monitored, Timeout performed, Emergency Drugs available and Suction available Patient Re-evaluated:Patient Re-evaluated prior to induction Oxygen Delivery Method: Circle system utilized Preoxygenation: Pre-oxygenation with 100% oxygen Induction Type: IV induction Ventilation: Mask ventilation without difficulty LMA: LMA inserted LMA Size: 5.0 Tube type: Oral Number of attempts: 1 Placement Confirmation: positive ETCO2 and breath sounds checked- equal and bilateral Tube secured with: Tape Dental Injury: Teeth and Oropharynx as per pre-operative assessment  Comments: Smooth atraumatic LMA placement, no complications noted.

## 2024-01-03 NOTE — H&P (Signed)
 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  The various methods of treatment have been discussed with the patient and family. After consideration of risks, benefits and other options for treatment, the patient has consented to   Procedure(s) with comments: ARTHRODESIS METATARSALPHALANGEAL JOINT (MTPJ) (Left) - BLOCK as a surgical intervention.  The patient's history has been reviewed, patient examined, no change in status, stable for surgery.  I have reviewed the patient's chart and labs.  Questions were answered to the patient's satisfaction.     [name]

## 2024-01-03 NOTE — Anesthesia Postprocedure Evaluation (Signed)
 Anesthesia Post Note  Patient: Aaron Page  Procedure(s) Performed: ARTHRODESIS METATARSALPHALANGEAL JOINT (MTPJ) (Left: Toe)  Patient location during evaluation: PACU Anesthesia Type: General Level of consciousness: awake and alert Pain management: pain level controlled Vital Signs Assessment: post-procedure vital signs reviewed and stable Respiratory status: spontaneous breathing, nonlabored ventilation, respiratory function stable and patient connected to nasal cannula oxygen Cardiovascular status: blood pressure returned to baseline and stable Postop Assessment: no apparent nausea or vomiting Anesthetic complications: no  No notable events documented.   Last Vitals:  Vitals:   01/03/24 0937 01/03/24 0953  BP:  (!) 142/96  Pulse: 87 87  Resp: (!) 31 18  Temp: (!) 36.2 C 36.5 C  SpO2: 97% 97%    Last Pain:  Vitals:   01/03/24 0953  TempSrc: Tympanic  PainSc: 0-No pain                 Stephanie Coup

## 2024-01-03 NOTE — Transfer of Care (Signed)
 Immediate Anesthesia Transfer of Care Note  Patient: Aaron Page  Procedure(s) Performed: ARTHRODESIS METATARSALPHALANGEAL JOINT (MTPJ) (Left: Toe)  Patient Location: PACU  Anesthesia Type:General  Level of Consciousness: drowsy  Airway & Oxygen Therapy: Patient Spontanous Breathing and Patient connected to face mask oxygen  Post-op Assessment: Report given to RN and Post -op Vital signs reviewed and stable  Post vital signs: Reviewed and stable  Last Vitals:  Vitals Value Taken Time  BP 134/86 01/03/24 0900  Temp 35.9 0900  Pulse 83 01/03/24 0902  Resp 13 01/03/24 0902  SpO2 99%   Vitals shown include unfiled device data.  Last Pain:  Vitals:   01/03/24 0618  TempSrc: Temporal  PainSc: 6          Complications: No notable events documented.

## 2024-01-05 ENCOUNTER — Encounter: Payer: Self-pay | Admitting: Podiatry

## 2024-01-09 ENCOUNTER — Ambulatory Visit (INDEPENDENT_AMBULATORY_CARE_PROVIDER_SITE_OTHER): Payer: BC Managed Care – PPO | Admitting: Podiatry

## 2024-01-09 ENCOUNTER — Ambulatory Visit (INDEPENDENT_AMBULATORY_CARE_PROVIDER_SITE_OTHER)

## 2024-01-09 DIAGNOSIS — M205X2 Other deformities of toe(s) (acquired), left foot: Secondary | ICD-10-CM

## 2024-01-09 DIAGNOSIS — Z9889 Other specified postprocedural states: Secondary | ICD-10-CM

## 2024-01-09 NOTE — Progress Notes (Signed)
  Subjective:  Patient ID: Aaron Page, male    DOB: [birth year],  MRN: [id number]   DOS: 01/03/2024 Procedure: 1. First metatarsal phalangeal joint arthrodesis, left foot   53 y.o. male seen for post op check.  He reports he is doing well he has occasional throbbing pain he did take 2 Percocet otherwise been taking the ibuprofen antibiotics and aspirin and elevating.  Overall doing well no concerns on his behalf he is using a knee scooter and being nonweightbearing as instructed.  Review of Systems: Negative except as noted in the HPI. Denies N/V/F/Ch.   Objective:  There were no vitals filed for this visit. There is no height or weight on file to calculate BMI. Constitutional Well developed. Well nourished.  Vascular Foot warm and well perfused. Capillary refill normal to all digits.   No calf pain with palpation  Neurologic Normal speech. Oriented to person, place, and time. Epicritic sensation intact  Dermatologic Incision well coapted no dehiscence erythema or drainage.  Orthopedic: Status post first MPJ arthrodesis with edema noted   Radiographs: 01/09/2024 XR 3 views AP lateral oblique of the left foot nonweightbearing.  Findings: Tension directed to the first MPJ there is dorsal locking plate and screws present spanning the joint with good apposition of the first metatarsal head and proximal phalanx base.  Alignment is maintained with more rectus alignment decreased medial prominence of the first metatarsal head and sagittal plane position maintained on lateral view.  Pathology: N/A  Micro: N/A  Assessment:   1. Post-operative state   2. Hallux limitus of left foot    Plan:  Patient was evaluated and treated and all questions answered.  POD # 6 s/p left foot first MPJ arthrodesis -Progressing as expected postoperatively -XR: Expected postoperative changes with good hallux position and no evidence of hardware failure -WB Status: Nonweightbearing in cam  boot -Sutures: Remain intact. -Medications/ABX: Finish antibiotics postop no further indication -Foot redressed. - Return in 7 days for suture removal        Corinna Gab, DPM [facility] / [facility]

## 2024-01-11 ENCOUNTER — Other Ambulatory Visit: Payer: Self-pay | Admitting: Family Medicine

## 2024-01-11 DIAGNOSIS — M205X2 Other deformities of toe(s) (acquired), left foot: Secondary | ICD-10-CM

## 2024-01-11 NOTE — Telephone Encounter (Signed)
 NOTED

## 2024-01-11 NOTE — Telephone Encounter (Signed)
 Faxed Disability claim form from [employer] to [phone]

## 2024-01-11 NOTE — Telephone Encounter (Signed)
 Faxed STD forms to [employer] at [phone] claim# [id number].

## 2024-01-18 ENCOUNTER — Encounter: Admitting: Podiatry

## 2024-01-18 ENCOUNTER — Telehealth: Payer: Self-pay | Admitting: Podiatry

## 2024-01-18 NOTE — Telephone Encounter (Signed)
 Called patient at [phone] and advised of forms were faxed to [facility] and [employer] and [employer] on 01/11/24. He said he had called and was transferred to a line and no one had called him back. He had not left any messages on my line [phone]. (Possibly on line of Raynelle Fanning). He will pick up copy of forms when he comes in on 3//18/25 to have for his records. He just wanted to make sure all had been faxed.

## 2024-01-19 ENCOUNTER — Ambulatory Visit (INDEPENDENT_AMBULATORY_CARE_PROVIDER_SITE_OTHER)

## 2024-01-19 ENCOUNTER — Ambulatory Visit (INDEPENDENT_AMBULATORY_CARE_PROVIDER_SITE_OTHER): Admitting: Podiatry

## 2024-01-19 ENCOUNTER — Encounter: Payer: Self-pay | Admitting: Podiatry

## 2024-01-19 VITALS — Ht 75.0 in | Wt 225.0 lb

## 2024-01-19 DIAGNOSIS — Z9889 Other specified postprocedural states: Secondary | ICD-10-CM | POA: Diagnosis not present

## 2024-01-19 NOTE — Progress Notes (Signed)
  Subjective:  Patient ID: Aaron Page, male    DOB: [birth year],  MRN: [id number]   DOS: 01/03/2024 Procedure: 1. First metatarsal phalangeal joint arthrodesis, left foot   53 y.o. male seen for post op check.  Patient is post to see Dr. Annamary Rummage for incision check who is out sick.  Patient had some concerns about the swelling and wanted to make sure everything is doing okay does state that he has been on his foot more in the boot lately and that this is causing him some soreness.  Review of Systems: Negative except as noted in the HPI. Denies N/V/F/Ch.   Objective:  There were no vitals filed for this visit. Body mass index is 28.12 kg/m. Constitutional Well developed. Well nourished.  Vascular Foot warm and well perfused. Capillary refill normal to all digits.   No calf pain with palpation  Neurologic Normal speech. Oriented to person, place, and time. Epicritic sensation intact  Dermatologic Incision well coapted no dehiscence erythema or drainage.  Orthopedic: Status post first MPJ arthrodesis with edema noted   Radiographs: 01/19/2024 XR 3 views AP lateral oblique of the left foot nonweightbearing.  Findings: Alignment of first MPJ arthrodesis site is maintained.  Surgical hardware is intact without signs of lucency  Pathology: N/A  Micro: N/A  Assessment:   1. Post-operative state    Plan:  Patient was evaluated and treated and all questions answered.  S/p left foot first MPJ arthrodesis -Progressing as expected postoperatively -XR: Expected postoperative changes with good hallux position and no evidence of hardware failure -WB Status: Nonweightbearing in cam boot -Sutures: Remain intact.  Does appear to have absorbable stitches with some Monocryl horizontal mattress stitches to the incision.  Will defer to surgeon Dr. Annamary Rummage on removal of these however the skin incision appears to be healing well. -Foot redressed.        Maddex Garlitz L. Jamse Arn, DPM [facility] / [facility]

## 2024-01-23 ENCOUNTER — Ambulatory Visit (INDEPENDENT_AMBULATORY_CARE_PROVIDER_SITE_OTHER): Admitting: Podiatry

## 2024-01-23 DIAGNOSIS — M205X2 Other deformities of toe(s) (acquired), left foot: Secondary | ICD-10-CM

## 2024-01-23 DIAGNOSIS — Z9889 Other specified postprocedural states: Secondary | ICD-10-CM

## 2024-01-23 NOTE — Progress Notes (Signed)
  Subjective:  Patient ID: Aaron Page, male    DOB: [birth year],  MRN: [id number]   DOS: 01/03/2024 Procedure: 1. First metatarsal phalangeal joint arthrodesis, left foot   52 y.o. male seen for post op check.   He is now 2.5 weeks status post above procedure.  He reports he is doing well he has minimal pain taking ibuprofen occasionally.  Has been wearing his cam boot and trying to stay off his foot though he did walk him at this appointment with boot on   Review of Systems: Negative except as noted in the HPI. Denies N/V/F/Ch.   Objective:  There were no vitals filed for this visit. There is no height or weight on file to calculate BMI. Constitutional Well developed. Well nourished.  Vascular Foot warm and well perfused. Capillary refill normal to all digits.   No calf pain with palpation  Neurologic Normal speech. Oriented to person, place, and time. Epicritic sensation intact  Dermatologic Incision well coapted no dehiscence erythema or drainage.  Orthopedic: Status post first MPJ arthrodesis , decreased edema   Radiographs: Deferred at this visit  Pathology: N/A  Micro: N/A  Assessment:   1. Post-operative state   2. Hallux limitus of left foot     Plan:  Patient was evaluated and treated and all questions answered.  2.5 weeks s/p left foot first MPJ arthrodesis -Progressing as expected postoperatively -Doing well with minimal pain -XR: Deferred today will take some at next appointment -WB Status: Nonweightbearing in cam boot -Sutures: Removed today -Medications/ABX: No antibiotics indicated, Tylenol ibuprofen as needed -Will have patient continue to be nonweightbearing for another week and a half and then transition to weightbearing until he comes back to see me in 3 weeks         Corinna Gab, DPM [facility] / [facility]

## 2024-01-30 DIAGNOSIS — Z0271 Encounter for disability determination: Secondary | ICD-10-CM

## 2024-01-30 NOTE — Telephone Encounter (Signed)
 Faxed [employer] [phone] notes/MRI/forms

## 2024-01-30 NOTE — Telephone Encounter (Signed)
 Faxed [employer] [phone] forms/notes/MRI

## 2024-02-15 ENCOUNTER — Encounter: Admitting: Podiatry

## 2024-02-15 ENCOUNTER — Ambulatory Visit (INDEPENDENT_AMBULATORY_CARE_PROVIDER_SITE_OTHER): Admitting: Podiatry

## 2024-02-15 ENCOUNTER — Ambulatory Visit (INDEPENDENT_AMBULATORY_CARE_PROVIDER_SITE_OTHER)

## 2024-02-15 DIAGNOSIS — M205X2 Other deformities of toe(s) (acquired), left foot: Secondary | ICD-10-CM

## 2024-02-15 DIAGNOSIS — Z9889 Other specified postprocedural states: Secondary | ICD-10-CM

## 2024-02-15 DIAGNOSIS — M2012 Hallux valgus (acquired), left foot: Secondary | ICD-10-CM

## 2024-02-15 DIAGNOSIS — M21612 Bunion of left foot: Secondary | ICD-10-CM

## 2024-02-15 NOTE — Progress Notes (Signed)
  Subjective:  Patient ID: Aaron Page, male    DOB: December 17, 1970,  MRN: [id number]   DOS: 01/03/2024 Procedure: 1. First metatarsal phalangeal joint arthrodesis, left foot   53 y.o. male seen for post op check.   He is now 6 weeks status post above procedure.   He reports he is doing well he denies pain has been walking in cam boot.  Still taking baby aspirin twice daily and taking occasional ibuprofen as needed but not really needing much at this point.   Review of Systems: Negative except as noted in the HPI. Denies N/V/F/Ch.   Objective:  There were no vitals filed for this visit. There is no height or weight on file to calculate BMI. Constitutional Well developed. Well nourished.  Vascular Foot warm and well perfused. Capillary refill normal to all digits.   No calf pain with palpation  Neurologic Normal speech. Oriented to person, place, and time. Epicritic sensation intact  Dermatologic Incision well healed with no open wounds.  Minimal scarring  Orthopedic: Status post first MPJ arthrodesis , edema significantly decreased hallux in rectus position.  Nontender to palpation   Radiographs: XR 3 views AP lateral oblique left foot weightbearing.  Findings: Excellent osseous infill noted across the arthrodesis site on oblique lateral and AP views.  Hardware is intact.  Alignment of the hallux well-maintained stable from postoperative.  Pathology: N/A  Micro: N/A  Assessment:   1. Hallux limitus of left foot   2. Hallux valgus with bunions, left   3. Post-operative state      Plan:  Patient was evaluated and treated and all questions answered.  6 weeks s/p left foot first MPJ arthrodesis -Progressing as expected postoperatively -Doing well with no pain and has been walking in cam boot -XR: Osseous infill across the arthrodesis site -WB Status: Continue weightbearing as tolerated, okay to transition out of the cam boot over the next week into regular shoes. -Want  him to hold off on return to work as he is on his feet for 13-hour shifts until May -Medications/ABX: No antibiotics indicated, Tylenol ibuprofen as needed - Weightbearing as tolerated in regular shoe at this point in time - Follow-up in 6 weeks to ensure that he has gotten back to weightbearing without any issues and shoe gear and will likely clear him for full activity         Corinna Gab, DPM [facility] / [facility]

## 2024-02-16 ENCOUNTER — Telehealth: Payer: Self-pay | Admitting: Podiatry

## 2024-02-16 DIAGNOSIS — Z0271 Encounter for disability determination: Secondary | ICD-10-CM

## 2024-02-16 NOTE — Telephone Encounter (Signed)
 Faxed to [employer] [phone] forms/notes with revised RTW date 04/05/24

## 2024-03-07 DIAGNOSIS — Z0271 Encounter for disability determination: Secondary | ICD-10-CM

## 2024-03-19 ENCOUNTER — Other Ambulatory Visit: Payer: Self-pay | Admitting: Family Medicine

## 2024-03-28 ENCOUNTER — Encounter: Admitting: Podiatry

## 2024-04-02 ENCOUNTER — Ambulatory Visit (INDEPENDENT_AMBULATORY_CARE_PROVIDER_SITE_OTHER): Admitting: Podiatry

## 2024-04-02 ENCOUNTER — Encounter: Payer: Self-pay | Admitting: Podiatry

## 2024-04-02 DIAGNOSIS — Z9889 Other specified postprocedural states: Secondary | ICD-10-CM

## 2024-04-02 DIAGNOSIS — M2012 Hallux valgus (acquired), left foot: Secondary | ICD-10-CM

## 2024-04-02 DIAGNOSIS — M21612 Bunion of left foot: Secondary | ICD-10-CM

## 2024-04-02 DIAGNOSIS — M205X2 Other deformities of toe(s) (acquired), left foot: Secondary | ICD-10-CM

## 2024-04-02 NOTE — Progress Notes (Signed)
  Subjective:  Patient ID: Aaron Page, male    DOB: Nov 26, 1970,  MRN: [id number]   DOS: 01/03/2024 Procedure: 1. First metatarsal phalangeal joint arthrodesis, left foot   53 y.o. male seen for post op check.   He is now 12 weeks status post above procedure.   He reports he is doing well he denies any pain and reports he is walking around better up to 45 minutes on the treadmill without any issues even with light jogging.   Review of Systems: Negative except as noted in the HPI. Denies N/V/F/Ch.   Objective:  There were no vitals filed for this visit. There is no height or weight on file to calculate BMI. Constitutional Well developed. Well nourished.  Vascular Foot warm and well perfused. Capillary refill normal to all digits.   No calf pain with palpation  Neurologic Normal speech. Oriented to person, place, and time. Epicritic sensation intact  Dermatologic Incision well healed with no open wounds.  Minimal scarring  Orthopedic: Status post first MPJ arthrodesis , no edema hallux in rectus position and transverse plane and in line with the second toe on sagittal plane. nontender to palpation   Radiographs: Deferred   Pathology: N/A  Micro: N/A  Assessment:   1. Hallux limitus of left foot   2. Hallux valgus with bunions, left   3. Post-operative state       Plan:  Patient was evaluated and treated and all questions answered.  12 weeks s/p left foot first MPJ arthrodesis -Progressing very well at this point in time.  He is fully healed after his fusion procedure back to walking long distances and for extended durations up to 45 minutes with no issues. -Doing well with no pain and has been walking in regular shoe at this point -XR: Previously seen complete osseous infill across the arthrodesis site, deferred further x-rays today -WB Status: Continue weightbearing as tolerated, cleared to return to full activity - Cleared to return to work on June  2 -Medications/ABX: No antibiotics indicated, Tylenol  ibuprofen  as needed - Follow-up as needed if any issues arise in the future.         Maridee Shoemaker, DPM [facility] / [facility]

## 2024-04-03 DIAGNOSIS — Z0271 Encounter for disability determination: Secondary | ICD-10-CM

## 2024-04-03 NOTE — Telephone Encounter (Signed)
 Recd [employer] form. Faxed back form/notes to [phone] RTW 04/08/24 with no restrictions

## 2024-05-23 ENCOUNTER — Ambulatory Visit: Admitting: Family Medicine

## 2024-06-04 ENCOUNTER — Other Ambulatory Visit: Payer: Self-pay | Admitting: Podiatry

## 2024-07-18 DIAGNOSIS — M25551 Pain in right hip: Secondary | ICD-10-CM | POA: Insufficient documentation

## 2024-07-18 DIAGNOSIS — M1611 Unilateral primary osteoarthritis, right hip: Secondary | ICD-10-CM | POA: Insufficient documentation

## 2024-08-13 ENCOUNTER — Encounter: Payer: Self-pay | Admitting: Family Medicine

## 2024-08-13 ENCOUNTER — Ambulatory Visit: Admitting: Family Medicine

## 2024-08-13 VITALS — BP 132/86 | HR 81 | Temp 97.9°F | Ht 75.0 in | Wt 236.0 lb

## 2024-08-13 DIAGNOSIS — N451 Epididymitis: Secondary | ICD-10-CM

## 2024-08-13 DIAGNOSIS — N50812 Left testicular pain: Secondary | ICD-10-CM

## 2024-08-13 LAB — POC URINALSYSI DIPSTICK (AUTOMATED)
Bilirubin, UA: NEGATIVE
Blood, UA: NEGATIVE
Glucose, UA: NEGATIVE
Ketones, UA: NEGATIVE
Leukocytes, UA: NEGATIVE
Nitrite, UA: NEGATIVE
Protein, UA: NEGATIVE
Spec Grav, UA: 1.02 (ref 1.010–1.025)
Urobilinogen, UA: 0.2 U/dL
pH, UA: 6 (ref 5.0–8.0)

## 2024-08-13 MED ORDER — SULFAMETHOXAZOLE-TRIMETHOPRIM 800-160 MG PO TABS: 1.0000 | ORAL_TABLET | Freq: Two times a day (BID) | ORAL | 0 refills | Status: AC

## 2024-08-13 NOTE — Progress Notes (Signed)
 Subjective:     Patient ID: Aaron Page, male    DOB: 26-Mar-1971, 53 y.o.   MRN: [id number]  Chief Complaint  Patient presents with   Groin Pain    [facility] at [facility] in may, left sided cramping pain since Thursday before last. Saw [facility] and they told him to continue ibuprofen , getting a little better since then but still wanted to get checked out     HPI  Discussed the use of AI scribe software for clinical note transcription with the patient, who gave verbal consent to proceed.  History of Present Illness Aaron Page is a 53 year old male who presents with testicular pain following a vasectomy.  Testicular pain - Onset of testicular pain and tenderness began on September 17th, approximately four months after vasectomy performed in May - Pain described as throbbing, tender to touch, and similar to post-vasectomy discomfort - Symptoms exacerbated after prolonged sitting during a long drive - Ibuprofen  provides partial relief, but persistent tenderness remains - No fever, chills, or other systemic symptoms - No abnormal penile discharge or dyspareunia - No recent biking or activities that could exacerbate pain  Lower urinary tract symptoms - History of frequent urination managed with Gemtesa - No dysuria or hematuria  Sexual and infectious history - No new sexual partners - Previous testing for sexually transmitted infections negative     Health Maintenance Due  Topic Date Due   Hepatitis B Vaccines 19-59 Average Risk (1 of 3 - 19+ 3-dose series) Never done   Zoster Vaccines- Shingrix (1 of 2) Never done   Pneumococcal Vaccine: 50+ Years (1 of 1 - PCV) Never done    Past Medical History:  Diagnosis Date   Allergy 1987   Arthritis of first metatarsophalangeal (MTP) joint of left foot 12/2023   Eczema    Essential hypertension    GAD (generalized anxiety disorder) 02/15/2016   Inguinal hernia 03/25/2015   Obstructive sleep apnea on CPAP     Overactive bladder    Sickle cell trait    Vitamin D  deficiency     Past Surgical History:  Procedure Laterality Date   ARTHRODESIS METATARSALPHALANGEAL JOINT (MTPJ) Left 01/03/2024   Procedure: ARTHRODESIS METATARSALPHALANGEAL JOINT (MTPJ);  Surgeon: Malvin Marsa FALCON, DPM;  Location: [facility];  Service: Orthopedics/Podiatry;  Laterality: Left;  BLOCK   COLONOSCOPY WITH PROPOFOL  N/A 11/05/2021   Procedure: COLONOSCOPY WITH PROPOFOL ;  Surgeon: Cindie Carlin POUR, DO;  Location: [facility];  Service: Endoscopy;  Laterality: N/A;  1:30pm   NO PAST SURGERIES     POLYPECTOMY  11/05/2021   Procedure: POLYPECTOMY;  Surgeon: Cindie Carlin POUR, DO;  Location: [facility];  Service: Endoscopy;;    Family History  Problem Relation Age of Onset   Arthritis Mother    COPD Father    Heart disease Father    Heart failure Father        died at age 56   Colon cancer Neg Hx    Colon polyps Neg Hx     Social History   Socioeconomic History   Marital status: Single    Spouse name: Not on file   Number of children: 3   Years of education: Not on file   Highest education level: Associate degree: occupational, Scientist, product/process development, or vocational program  Occupational History   Not on file  Tobacco Use   Smoking status: Never   Smokeless tobacco: Never  Vaping Use   Vaping status: Never Used  Substance and  Sexual Activity   Alcohol use: Yes    Alcohol/week: 6.0 standard drinks of alcohol    Types: 2 Glasses of wine, 2 Cans of beer, 2 Standard drinks or equivalent per week    Comment: drinks approx 4 times/month; beer, mixed drinks   Drug use: Not Currently    Types: Marijuana   Sexual activity: Yes    Birth control/protection: Condom  Other Topics Concern   Not on file  Social History Narrative   Lives alone   Social Drivers of Health   Financial Resource Strain: Low Risk  (05/19/2024)   Overall Financial Resource Strain (CARDIA)    Difficulty of Paying Living Expenses: Not very  hard  Food Insecurity: No Food Insecurity (05/19/2024)   Hunger Vital Sign    Worried About Running Out of Food in the Last Year: Never true    Ran Out of Food in the Last Year: Never true  Transportation Needs: No Transportation Needs (05/19/2024)   PRAPARE - Administrator, Civil Service (Medical): No    Lack of Transportation (Non-Medical): No  Physical Activity: Insufficiently Active (05/19/2024)   Exercise Vital Sign    Days of Exercise per Week: 3 days    Minutes of Exercise per Session: 20 min  Stress: No Stress Concern Present (05/19/2024)   [employer] of Occupational Health - Occupational Stress Questionnaire    Feeling of Stress: Only a little  Social Connections: Moderately Integrated (05/19/2024)   Social Connection and Isolation Panel    Frequency of Communication with Friends and Family: More than three times a week    Frequency of Social Gatherings with Friends and Family: Once a week    Attends Religious Services: More than 4 times per year    Active Member of [employer] or Organizations: Yes    Attends Banker Meetings: 1 to 4 times per year    Marital Status: Never married  Intimate Partner Violence: Unknown (02/08/2022)   Received from [facility]   HITS    Physically Hurt: Not on file    Insult or Talk Down To: Not on file    Threaten Physical Harm: Not on file    Scream or Curse: Not on file    Outpatient Medications Prior to Visit  Medication Sig Dispense Refill   acetaminophen  (TYLENOL ) 325 MG tablet Take 650 mg by mouth every 6 (six) hours as needed.     gabapentin  (NEURONTIN ) 300 MG capsule Take 1 capsule (300 mg total) by mouth 3 (three) times daily. (Patient taking differently: Take 300 mg by mouth 2 (two) times daily as needed (pain).) 90 capsule 1   ibuprofen  (ADVIL ) 800 MG tablet Take 800 mg by mouth every 8 (eight) hours as needed for moderate pain.     ibuprofen  (ADVIL ) 800 MG tablet TAKE 1 TABLET BY MOUTH EVERY 8 HOURS AS  NEEDED 30 tablet 1   Multiple Vitamin (MULTIVITAMIN WITH MINERALS) TABS tablet Take 1 tablet by mouth daily. 90 tablet 1   triamcinolone  cream (KENALOG ) 0.1 % APPLY TO AFFECTED AREA TWICE A DAY 30 g 1   Vibegron (GEMTESA) 75 MG TABS Take 75 mg by mouth daily.     dextromethorphan-guaiFENesin (MUCINEX DM) 30-600 MG 12hr tablet Take 1 tablet by mouth daily.     No facility-administered medications prior to visit.    Allergies  Allergen Reactions   Penicillins Anaphylaxis, Swelling and Rash    Review of Systems  Constitutional:  Negative for chills, fever and  malaise/fatigue.  Respiratory:  Negative for shortness of breath.   Cardiovascular:  Negative for chest pain, palpitations and leg swelling.  Gastrointestinal:  Negative for abdominal pain, constipation, diarrhea, nausea and vomiting.  Genitourinary:  Negative for dysuria, frequency, hematuria and urgency.  Musculoskeletal:  Negative for back pain and myalgias.  Neurological:  Negative for dizziness and focal weakness.       Objective:    Physical Exam Constitutional:      General: He is not in acute distress.    Appearance: He is not ill-appearing.  Eyes:     Extraocular Movements: Extraocular movements intact.     Conjunctiva/sclera: Conjunctivae normal.  Cardiovascular:     Rate and Rhythm: Normal rate.  Pulmonary:     Effort: Pulmonary effort is normal.  Genitourinary:    Penis: Normal.      Testes:        Left: Tenderness present.     Epididymis:     Right: Normal.     Left: Tenderness present.  Musculoskeletal:     Cervical back: Normal range of motion and neck supple.  Lymphadenopathy:     Lower Body: No right inguinal adenopathy. No left inguinal adenopathy.  Skin:    General: Skin is warm and dry.  Neurological:     General: No focal deficit present.     Mental Status: He is alert and oriented to person, place, and time.  Psychiatric:        Mood and Affect: Mood normal.        Behavior: Behavior  normal.        Thought Content: Thought content normal.      BP 132/86   Pulse 81   Temp 97.9 F (36.6 C) (Temporal)   Ht 6' 3 (1.905 m)   Wt 236 lb (107 kg)   SpO2 97%   BMI 29.50 kg/m  Wt Readings from Last 3 Encounters:  08/13/24 236 lb (107 kg)  01/19/24 225 lb (102.1 kg)  01/03/24 225 lb (102.1 kg)       Assessment & Plan:   Problem List Items Addressed This Visit   None Visit Diagnoses       Epididymitis, left    -  Primary   Relevant Orders   POCT Urinalysis Dipstick (Automated) (Completed)     Tenderness of left testicle       Relevant Orders   POCT Urinalysis Dipstick (Automated) (Completed)   GC/Chlamydia Probe Amp       Assessment and Plan Assessment & Plan Epididymitis, left testicle Presents with tenderness and soreness in the left testicle since September 17th. Pain was initially severe, especially after prolonged sitting, but has improved with ibuprofen . No fever, chills, or urinary symptoms. Physical examination reveals tenderness in the left testicle without lymphadenopathy. Differential diagnosis includes sexually transmitted infections, but he reports no new sexual partners and previous tests were negative. Urinalysis is negative. low suspicion for gonorrhea or chlamydia. - Treat empirically with Bactrim. - Rule out gonorrhea and chlamydia. - Follow up if symptoms worsen or do not return to baseline after completing antibiotics. - Consider referral to urology or ultrasound of the left testicle if symptoms persist.      I have discontinued Grace Patton's dextromethorphan-guaiFENesin. I am also having him start on sulfamethoxazole-trimethoprim. Additionally, I am having him maintain his ibuprofen , multivitamin with minerals, gabapentin , Gemtesa, acetaminophen , triamcinolone  cream, and ibuprofen .  Meds ordered this encounter  Medications   sulfamethoxazole-trimethoprim (BACTRIM DS) 800-160 MG tablet  Sig: Take 1 tablet by mouth 2 (two)  times daily.    Dispense:  20 tablet    Refill:  0    Supervising Provider:   ROLLENE NORRIS A [[id number]]

## 2024-08-13 NOTE — Patient Instructions (Signed)
 Take the antibiotic as prescribed.  I will be in touch with the remaining urine test results  If your symptoms worsen or if they are not back to baseline when you complete the antibiotic, you will most likely need an ultrasound.  You may follow-up with your urologist as well.

## 2024-08-16 ENCOUNTER — Ambulatory Visit: Payer: Self-pay | Admitting: Family Medicine

## 2024-08-16 DIAGNOSIS — L731 Pseudofolliculitis barbae: Secondary | ICD-10-CM

## 2024-08-16 LAB — GC/CHLAMYDIA PROBE AMP
Chlamydia trachomatis, NAA: NEGATIVE
Neisseria Gonorrhoeae by PCR: NEGATIVE

## 2024-08-21 NOTE — Telephone Encounter (Signed)
 Ok for derm referral? Or does he need ov?

## 2024-08-21 NOTE — Addendum Note (Signed)
 Addended by: Avigdor Dollar E on: 08/21/2024 10:22 AM   Modules accepted: Orders

## 2025-04-01 ENCOUNTER — Ambulatory Visit: Admitting: Physician Assistant
